# Patient Record
Sex: Female | Born: 1965 | Hispanic: No | Marital: Married | Smoking: Never smoker
Health system: Southern US, Community
[De-identification: ages and names within clinical notes are randomized; demographics above are authoritative.]

## PROBLEM LIST (undated history)

## (undated) DIAGNOSIS — T7840XA Allergy, unspecified, initial encounter: Secondary | ICD-10-CM

## (undated) DIAGNOSIS — E785 Hyperlipidemia, unspecified: Secondary | ICD-10-CM

## (undated) DIAGNOSIS — N83201 Unspecified ovarian cyst, right side: Secondary | ICD-10-CM

## (undated) DIAGNOSIS — Z973 Presence of spectacles and contact lenses: Secondary | ICD-10-CM

## (undated) DIAGNOSIS — M1990 Unspecified osteoarthritis, unspecified site: Secondary | ICD-10-CM

## (undated) DIAGNOSIS — I10 Essential (primary) hypertension: Secondary | ICD-10-CM

## (undated) DIAGNOSIS — D229 Melanocytic nevi, unspecified: Secondary | ICD-10-CM

## (undated) DIAGNOSIS — K219 Gastro-esophageal reflux disease without esophagitis: Secondary | ICD-10-CM

## (undated) HISTORY — DX: Unspecified osteoarthritis, unspecified site: M19.90

## (undated) HISTORY — DX: Hyperlipidemia, unspecified: E78.5

## (undated) HISTORY — DX: Allergy, unspecified, initial encounter: T78.40XA

## (undated) HISTORY — DX: Essential (primary) hypertension: I10

## (undated) HISTORY — PX: OTHER SURGICAL HISTORY: SHX169

## (undated) HISTORY — DX: Melanocytic nevi, unspecified: D22.9

---

## 2007-12-15 LAB — HM PAP SMEAR: HM Pap smear: NORMAL

## 2007-12-20 ENCOUNTER — Encounter: Admission: RE | Admit: 2007-12-20 | Discharge: 2007-12-20 | Payer: Self-pay | Admitting: Family Medicine

## 2011-01-29 ENCOUNTER — Ambulatory Visit (INDEPENDENT_AMBULATORY_CARE_PROVIDER_SITE_OTHER): Payer: BC Managed Care – PPO

## 2011-01-29 DIAGNOSIS — M79609 Pain in unspecified limb: Secondary | ICD-10-CM

## 2011-02-05 ENCOUNTER — Ambulatory Visit (INDEPENDENT_AMBULATORY_CARE_PROVIDER_SITE_OTHER): Payer: BC Managed Care – PPO

## 2011-02-05 DIAGNOSIS — S60459A Superficial foreign body of unspecified finger, initial encounter: Secondary | ICD-10-CM

## 2011-02-05 DIAGNOSIS — M12269 Villonodular synovitis (pigmented), unspecified knee: Secondary | ICD-10-CM

## 2011-10-25 ENCOUNTER — Ambulatory Visit (INDEPENDENT_AMBULATORY_CARE_PROVIDER_SITE_OTHER): Payer: BC Managed Care – PPO | Admitting: Physician Assistant

## 2011-10-25 VITALS — BP 122/80 | HR 86 | Temp 97.5°F | Resp 16 | Ht 61.0 in | Wt 132.0 lb

## 2011-10-25 DIAGNOSIS — L309 Dermatitis, unspecified: Secondary | ICD-10-CM

## 2011-10-25 DIAGNOSIS — L259 Unspecified contact dermatitis, unspecified cause: Secondary | ICD-10-CM

## 2011-10-25 MED ORDER — TRIAMCINOLONE ACETONIDE 0.1 % EX CREA: TOPICAL_CREAM | Freq: Two times a day (BID) | CUTANEOUS | Status: DC

## 2011-10-25 NOTE — Patient Instructions (Signed)
Take claritin or zyrtec each morning, and benadryl at bedtime (since it can make you drowsy).

## 2011-10-25 NOTE — Progress Notes (Signed)
  Subjective:    Patient ID: Ashlee Davis, female    DOB: October 05, 1965, 46 y.o.   MRN: [id number]  HPI This 46 y.o. female presents for evaluation of rash on the trunk.  No new products, exposures, pets.  She has a history of similar rash with drinking alcohol, and eating some seafoods/shellfish, but has not consumed any of those items.  Mildly itchy.  Has taken a few doses of benadryl since these symptoms began.    Review of Systems As above, also mild nasal congestion and drainage.  No CP, SOB, vision change, HA, dizziness, swelling.  History reviewed. No pertinent past medical history.  History reviewed. No pertinent past surgical history.  Prior to Admission medications   Medication Sig Start Date End Date Taking? Authorizing Provider  aspirin 81 MG tablet Take 81 mg by mouth daily.   Yes Historical Provider, MD    No Known Allergies  History   Social History  . Marital Status: Married    Spouse Name: Ferninand    Number of Children: 0  . Years of Education: 12   Occupational History  . homemaker    Social History Main Topics  . Smoking status: Never Smoker   . Smokeless tobacco: Never Used  . Alcohol Use: No  . Drug Use: No  . Sexually Active: Yes -- Female partner(s)   Other Topics Concern  . Not on file   Social History Narrative   From [place] left [place] age 50 for the [place].    Family History  Problem Relation Age of Onset  . Hypertension Mother        Objective:   Physical Exam  Blood pressure 122/80, pulse 86, temperature 97.5 F (36.4 C), temperature source Oral, resp. rate 16, height 5\' 1"  (1.549 m), weight 132 lb (59.875 kg), last menstrual period 09/16/2011, SpO2 98.00%. Body mass index is 24.94 kg/(m^2). Well-developed, well nourished [place] who is awake, alert and oriented, in NAD. HEENT: [place]/AT, sclera and conjunctiva are clear.   Neck: supple, non-tender, no lymphadenopathy, thyromegaly. Heart: RRR, no murmur Lungs: normal effort,  CTA Extremities: no cyanosis, clubbing or edema. Skin: warm and dry with a small maculopapular rash of the trunk, with scattered lesions on the thighs and arms.  Spares the face, scalp, palms, soles.    Assessment & Plan:   1. Dermatitis  triamcinolone cream (KENALOG) 0.1 %   Patient Instructions  Take claritin or zyrtec each morning, and benadryl at bedtime (since it can make you drowsy).

## 2012-07-01 ENCOUNTER — Encounter: Payer: Self-pay | Admitting: Family Medicine

## 2012-07-01 ENCOUNTER — Ambulatory Visit (INDEPENDENT_AMBULATORY_CARE_PROVIDER_SITE_OTHER): Payer: BC Managed Care – PPO | Admitting: Family Medicine

## 2012-07-01 ENCOUNTER — Ambulatory Visit: Payer: BC Managed Care – PPO | Admitting: Family Medicine

## 2012-07-01 VITALS — BP 131/85 | HR 67 | Temp 96.6°F | Resp 18 | Ht 60.5 in | Wt 132.0 lb

## 2012-07-01 DIAGNOSIS — Z0000 Encounter for general adult medical examination without abnormal findings: Secondary | ICD-10-CM

## 2012-07-01 DIAGNOSIS — Z23 Encounter for immunization: Secondary | ICD-10-CM

## 2012-07-01 DIAGNOSIS — Z0001 Encounter for general adult medical examination with abnormal findings: Secondary | ICD-10-CM | POA: Insufficient documentation

## 2012-07-01 LAB — POCT UA - MICROSCOPIC ONLY
Bacteria, U Microscopic: NEGATIVE
Casts, Ur, LPF, POC: NEGATIVE
Crystals, Ur, HPF, POC: NEGATIVE
Mucus, UA: NEGATIVE

## 2012-07-01 LAB — COMPREHENSIVE METABOLIC PANEL
ALT: 11 U/L (ref 0–35)
AST: 15 U/L (ref 0–37)
Alkaline Phosphatase: 45 U/L (ref 39–117)
BUN: 11 mg/dL (ref 6–23)
Calcium: 9.3 mg/dL (ref 8.4–10.5)
Chloride: 106 mEq/L (ref 96–112)
Creat: 0.59 mg/dL (ref 0.50–1.10)
Potassium: 4.4 mEq/L (ref 3.5–5.3)

## 2012-07-01 LAB — LIPID PANEL
Cholesterol: 197 mg/dL (ref 0–200)
LDL Cholesterol: 110 mg/dL — ABNORMAL HIGH (ref 0–99)
Total CHOL/HDL Ratio: 3.9 Ratio
Triglycerides: 181 mg/dL — ABNORMAL HIGH (ref <150)
VLDL: 36 mg/dL (ref 0–40)

## 2012-07-01 LAB — POCT URINALYSIS DIPSTICK
Bilirubin, UA: NEGATIVE
Glucose, UA: NEGATIVE
Ketones, UA: NEGATIVE
Spec Grav, UA: 1.02
pH, UA: 6.5

## 2012-07-01 LAB — CBC WITH DIFFERENTIAL/PLATELET
Basophils Absolute: 0 10*3/uL (ref 0.0–0.1)
Basophils Relative: 1 % (ref 0–1)
HCT: 43.7 % (ref 36.0–46.0)
Lymphocytes Relative: 38 % (ref 12–46)
MCHC: 34.3 g/dL (ref 30.0–36.0)
Monocytes Absolute: 0.4 10*3/uL (ref 0.1–1.0)
Neutro Abs: 3.8 10*3/uL (ref 1.7–7.7)
Platelets: 256 10*3/uL (ref 150–400)
RDW: 13.6 % (ref 11.5–15.5)
WBC: 7.1 10*3/uL (ref 4.0–10.5)

## 2012-07-01 NOTE — Progress Notes (Signed)
[address]   [place], [place]  [address]   [phone]  Subjective:    Patient ID: Ashlee Davis, female    DOB: 10/18/1965, 47 y.o.   MRN: [id number]  HPI This 47 y.o. female presents for complete physical exam.  Last physical 2013. Pap smear a few years ago; not interested in repeat today. Mammogram; not interested; refusing repeat mammogram; performs self breast exams monthly.   Colonoscopy never.   Tetanus UTD but gets lots of bites; not sure of last date; requesting repeat vaccination.  No flu vaccines. Eye exam two years ago; +glasses; due for follow-up. Dental exam last year 2013.   1.  R elbow pain: no injury.  R handed.  Along epicondylar region; no swelling; no warmth; no redness.  Works in garden a lot; lifts a lot with R hand.  No numbness/tingling/weakness.   Review of Systems  Constitutional: Negative for fever, chills, diaphoresis, activity change, appetite change, fatigue and unexpected weight change.  HENT: Positive for congestion, rhinorrhea, sneezing and postnasal drip. Negative for hearing loss, ear pain, sore throat, facial swelling, neck pain, neck stiffness, dental problem, voice change, sinus pressure, tinnitus and ear discharge.   Eyes: Negative for photophobia, pain, discharge, redness, itching and visual disturbance.  Respiratory: Negative for cough, shortness of breath, wheezing and stridor.   Cardiovascular: Negative for chest pain, palpitations and leg swelling.  Gastrointestinal: Negative for nausea, vomiting, abdominal pain, diarrhea, constipation, blood in stool, abdominal distention, anal bleeding and rectal pain.  Endocrine: Negative for cold intolerance, heat intolerance, polydipsia, polyphagia and polyuria.  Genitourinary: Negative for dysuria, urgency, frequency, hematuria, menstrual problem and pelvic pain.  Musculoskeletal: Positive for arthralgias. Negative for myalgias, back pain, joint swelling and gait problem.  Skin: Negative for  color change, pallor, rash and wound.  Allergic/Immunologic: Negative for environmental allergies, food allergies and immunocompromised state.  Neurological: Negative for dizziness, tremors, seizures, syncope, facial asymmetry, speech difficulty, weakness, light-headedness, numbness and headaches.  Hematological: Negative for adenopathy. Does not bruise/bleed easily.  Psychiatric/Behavioral: Negative for sleep disturbance and dysphoric mood. The patient is not nervous/anxious.     Past Medical History  Diagnosis Date  . Allergy     History reviewed. No pertinent past surgical history.  Prior to Admission medications   Medication Sig Start Date End Date Taking? Authorizing Provider  aspirin 81 MG tablet Take 81 mg by mouth daily.   Yes Historical Provider, MD    Allergies  Allergen Reactions  . Wine (Alcohol)     History   Social History  . Marital Status: Married    Spouse Name: Ferninand    Number of Children: 0  . Years of Education: 12   Occupational History  . homemaker    Social History Main Topics  . Smoking status: Never Smoker   . Smokeless tobacco: Never Used  . Alcohol Use: No  . Drug Use: No  . Sexually Active: Yes -- Female partner(s)   Other Topics Concern  . Not on file   Social History Narrative   Marital status: married since 1999.      Children:  None      Lives: with husband; from [place]; moved to [place] to [place].   From [place] left at age 89 for the [place].      Tobacco: never      Alcohol: socially but rare due to skin flushing.      Exercises:  Walking every morning; works in garden a lot.  Family History  Problem Relation Age of Onset  . Hypertension Mother        Objective:   Physical Exam  Nursing note and vitals reviewed. Constitutional: She is oriented to person, place, and time. She appears well-developed and well-nourished. No distress.  HENT:  Head: Normocephalic and atraumatic.  Right Ear: External ear  normal.  Left Ear: External ear normal.  Nose: Nose normal.  Mouth/Throat: Oropharynx is clear and moist.  Eyes: Conjunctivae and EOM are normal. Pupils are equal, round, and reactive to light.  Neck: Normal range of motion. Neck supple. No thyromegaly present.  Cardiovascular: Normal rate, regular rhythm and normal heart sounds.   No murmur heard. Pulmonary/Chest: Effort normal and breath sounds normal.  Abdominal: Soft. Bowel sounds are normal. She exhibits no distension. There is no tenderness. There is no rebound and no guarding.  Genitourinary: No breast swelling, tenderness, discharge or bleeding.  Musculoskeletal: Normal range of motion.  Lymphadenopathy:    She has no cervical adenopathy.  Neurological: She is alert and oriented to person, place, and time. No cranial nerve deficit. She exhibits normal muscle tone. Coordination normal.  Skin: Skin is warm and dry. No rash noted. She is not diaphoretic. No erythema.  Psychiatric: She has a normal mood and affect. Her behavior is normal. Judgment and thought content normal.   TDAP ADMINISTERED IN OFFICE.    Assessment & Plan:  Routine general medical examination at a health care facility - Plan: CBC with Differential, Comprehensive metabolic panel, Hemoglobin A1c, Lipid panel, TSH, Vitamin B12, Vitamin D 25 hydroxy, POCT urinalysis dipstick, Tdap vaccine greater than or equal to 7yo IM, POCT UA - Microscopic Only, CANCELED: EKG 12-Lead

## 2012-07-01 NOTE — Assessment & Plan Note (Signed)
Anticipatory guidance --- weight maintenance, exercise, monitor BP at home.  Refused pap smear and mammogram; breast exam completed; declined pelvic exam.  S/p TDAP in office. Obtain labs.

## 2012-07-01 NOTE — Assessment & Plan Note (Signed)
Administered in office.

## 2012-07-13 ENCOUNTER — Encounter: Payer: Self-pay | Admitting: Family Medicine

## 2012-08-09 ENCOUNTER — Encounter: Payer: Self-pay | Admitting: *Deleted

## 2012-12-08 ENCOUNTER — Other Ambulatory Visit: Payer: Self-pay

## 2014-01-17 ENCOUNTER — Ambulatory Visit (INDEPENDENT_AMBULATORY_CARE_PROVIDER_SITE_OTHER): Payer: BC Managed Care – PPO | Admitting: Family Medicine

## 2014-01-17 ENCOUNTER — Encounter: Payer: Self-pay | Admitting: Family Medicine

## 2014-01-17 VITALS — BP 116/90 | HR 74 | Temp 97.4°F | Resp 16 | Ht 60.0 in | Wt 135.4 lb

## 2014-01-17 DIAGNOSIS — Z131 Encounter for screening for diabetes mellitus: Secondary | ICD-10-CM

## 2014-01-17 DIAGNOSIS — Z1329 Encounter for screening for other suspected endocrine disorder: Secondary | ICD-10-CM

## 2014-01-17 DIAGNOSIS — E785 Hyperlipidemia, unspecified: Secondary | ICD-10-CM

## 2014-01-17 DIAGNOSIS — Z0000 Encounter for general adult medical examination without abnormal findings: Secondary | ICD-10-CM

## 2014-01-17 DIAGNOSIS — Z01419 Encounter for gynecological examination (general) (routine) without abnormal findings: Secondary | ICD-10-CM

## 2014-01-17 LAB — POCT URINALYSIS DIPSTICK
Bilirubin, UA: NEGATIVE
Glucose, UA: NEGATIVE
Ketones, UA: NEGATIVE
LEUKOCYTES UA: NEGATIVE
NITRITE UA: NEGATIVE
Protein, UA: NEGATIVE
Urobilinogen, UA: 0.2
pH, UA: 5.5

## 2014-01-17 LAB — CBC WITH DIFFERENTIAL/PLATELET
BASOS PCT: 0 % (ref 0–1)
Basophils Absolute: 0 10*3/uL (ref 0.0–0.1)
EOS ABS: 0.1 10*3/uL (ref 0.0–0.7)
Eosinophils Relative: 1 % (ref 0–5)
HCT: 42.1 % (ref 36.0–46.0)
HEMOGLOBIN: 14.6 g/dL (ref 12.0–15.0)
LYMPHS ABS: 2.9 10*3/uL (ref 0.7–4.0)
LYMPHS PCT: 38 % (ref 12–46)
MCH: 31.9 pg (ref 26.0–34.0)
MCHC: 34.7 g/dL (ref 30.0–36.0)
MCV: 92.1 fL (ref 78.0–100.0)
MONO ABS: 0.4 10*3/uL (ref 0.1–1.0)
MONOS PCT: 5 % (ref 3–12)
MPV: 9.5 fL (ref 9.4–12.4)
NEUTROS PCT: 56 % (ref 43–77)
Neutro Abs: 4.2 10*3/uL (ref 1.7–7.7)
Platelets: 248 10*3/uL (ref 150–400)
RBC: 4.57 MIL/uL (ref 3.87–5.11)
RDW: 13.7 % (ref 11.5–15.5)
WBC: 7.5 10*3/uL (ref 4.0–10.5)

## 2014-01-17 NOTE — Progress Notes (Signed)
   Subjective:    Patient ID: Ashlee Davis, female    DOB: 1966/02/02, 48 y.o.   MRN: [id number]  HPI    Review of Systems  Constitutional: Negative.   HENT: Negative.   Eyes: Negative.   Respiratory: Negative.   Cardiovascular: Negative.   Gastrointestinal: Negative.   Endocrine: Negative.   Genitourinary: Negative.   Musculoskeletal: Negative.   Skin: Negative.   Allergic/Immunologic: Negative.   Neurological: Negative.   Hematological: Negative.   Psychiatric/Behavioral: Negative.        Objective:   Physical Exam        Assessment & Plan:

## 2014-01-17 NOTE — Patient Instructions (Signed)

## 2014-01-17 NOTE — Progress Notes (Addendum)
Subjective:    Patient ID: Ashlee Davis, female    DOB: October 17, 1965, 48 y.o.   MRN: [id number]  01/17/2014  Annual Exam   HPI This 48 y.o. female presents for Complete Physical Examination.  Last physical: 07/01/2012 Pap smear: 2012; regular menses; monthly; this menses short and dark.  Declining today. Mammogram:  2009; declining this year. Colonoscopy:  never TDAP: 2014 Influenza: never Eye exam:  Glasses; 2015. Dental exam:  Every year in [place].    Review of Systems  Constitutional: Negative for fever, chills, diaphoresis, activity change, appetite change, fatigue and unexpected weight change.  HENT: Negative for congestion, dental problem, drooling, ear discharge, ear pain, facial swelling, hearing loss, mouth sores, nosebleeds, postnasal drip, rhinorrhea, sinus pressure, sneezing, sore throat, tinnitus, trouble swallowing and voice change.   Eyes: Negative for photophobia, pain, discharge, redness, itching and visual disturbance.  Respiratory: Negative for apnea, cough, choking, chest tightness, shortness of breath, wheezing and stridor.   Cardiovascular: Negative for chest pain, palpitations and leg swelling.  Gastrointestinal: Negative for nausea, vomiting, abdominal pain, diarrhea, constipation, blood in stool, abdominal distention, anal bleeding and rectal pain.  Endocrine: Negative for cold intolerance, heat intolerance, polydipsia, polyphagia and polyuria.  Genitourinary: Negative for dysuria, urgency, frequency, hematuria, flank pain, decreased urine volume, vaginal bleeding, vaginal discharge, enuresis, difficulty urinating, genital sores, vaginal pain, menstrual problem, pelvic pain and dyspareunia.  Musculoskeletal: Negative for myalgias, back pain, joint swelling, arthralgias, gait problem, neck pain and neck stiffness.  Skin: Negative for color change, pallor, rash and wound.  Allergic/Immunologic: Negative for environmental allergies, food allergies and  immunocompromised state.  Neurological: Negative for dizziness, tremors, seizures, syncope, facial asymmetry, speech difficulty, weakness, light-headedness, numbness and headaches.  Hematological: Negative for adenopathy. Does not bruise/bleed easily.  Psychiatric/Behavioral: Negative for suicidal ideas, hallucinations, behavioral problems, confusion, sleep disturbance, self-injury, dysphoric mood, decreased concentration and agitation. The patient is not nervous/anxious and is not hyperactive.     Past Medical History  Diagnosis Date  . Allergy   . Nevus   . Other and unspecified hyperlipidemia   . Arthritis    History reviewed. No pertinent past surgical history. Allergies  Allergen Reactions  . Wine [Alcohol]    History   Social History  . Marital Status: Married    Spouse Name: Ferninand  . Number of Children: 0  . Years of Education: 12   Occupational History  . homemaker    Social History Main Topics  . Smoking status: Never Smoker   . Smokeless tobacco: Never Used  . Alcohol Use: No  . Drug Use: No  . Sexual Activity:    Partners: Male   Other Topics Concern  . Not on file   Social History Narrative   Marital status: married since 1999.  Happily married; no abuse.      Children:  None      Lives: with husband; from [place]; moved to [place] to [place].   From the [place], left at age 58 for the [place].      Tobacco: never      Alcohol: socially but rare due to skin flushing.      Exercises:  Walking every morning; works in garden a lot.   Always uses seat belts. Smoke alarm in the home. No guns in the home.   Caffeine use: in moderation daily.   Family History  Problem Relation Age of Onset  . Hypertension Mother   . Heart disease Mother 57    AMI   .  Hypertension Father         Objective:    BP 116/90 mmHg  Pulse 74  Temp(Src) 97.4 F (36.3 C) (Oral)  Resp 16  Ht 5' (1.524 m)  Wt 135 lb 6.4 oz (61.417 kg)  BMI 26.44 kg/m2  SpO2 96%   LMP 01/14/2014 Physical Exam  Constitutional: She is oriented to person, place, and time. She appears well-developed and well-nourished. No distress.  HENT:  Head: Normocephalic and atraumatic.  Right Ear: External ear normal.  Left Ear: External ear normal.  Nose: Nose normal.  Mouth/Throat: Oropharynx is clear and moist.  Eyes: Conjunctivae and EOM are normal. Pupils are equal, round, and reactive to light.  Neck: Normal range of motion and full passive range of motion without pain. Neck supple. No JVD present. Carotid bruit is not present. No thyromegaly present.  Cardiovascular: Normal rate, regular rhythm and normal heart sounds.  Exam reveals no gallop and no friction rub.   No murmur heard. Pulmonary/Chest: Effort normal and breath sounds normal. She has no wheezes. She has no rales. Right breast exhibits no inverted nipple, no mass, no nipple discharge, no skin change and no tenderness. Left breast exhibits no inverted nipple, no mass, no nipple discharge, no skin change and no tenderness. Breasts are symmetrical.  Abdominal: Soft. Bowel sounds are normal. She exhibits no distension and no mass. There is no tenderness. There is no rebound and no guarding.  Genitourinary: Rectum normal, vagina normal and uterus normal. There is no rash, tenderness, lesion or injury on the right labia. There is no rash, tenderness, lesion or injury on the left labia. Cervix exhibits no motion tenderness, no discharge and no friability. Right adnexum displays no mass, no tenderness and no fullness. Left adnexum displays no mass, no tenderness and no fullness.  Musculoskeletal:       Right shoulder: Normal.       Left shoulder: Normal.       Cervical back: Normal.  Lymphadenopathy:    She has no cervical adenopathy.  Neurological: She is alert and oriented to person, place, and time. She has normal reflexes. No cranial nerve deficit. She exhibits normal muscle tone. Coordination normal.  Skin: Skin is warm  and dry. No rash noted. She is not diaphoretic. No erythema. No pallor.  Psychiatric: She has a normal mood and affect. Her behavior is normal. Judgment and thought content normal.  Nursing note and vitals reviewed.       Assessment & Plan:   1. Routine general medical examination at a health care facility   2. Hyperlipidemia   3. Screening for diabetes mellitus   4. Screening for thyroid disorder   5. Encounter for routine gynecological examination     1. Complete Physical Examination: anticipatory guidance -- exercise, weight loss.  Pap smear obtained; breast exam performed; pt refuses mammogram.  Immunizations reviewed; pt refuses flu vaccine.   2.  Gynecological exam: pap smear obtained; pt refuses mammogram.  Perimenopausal state; menses are lighter. 3.  Screening DMII: obtain glucose, HgbA1c. 4.  Screening thyroid: obtain TSH. 5. Hyperlipidemia: obtain FLP.   No orders of the defined types were placed in this encounter.    Return in about 1 year (around 01/18/2015) for complete physical examiniation.    Reginia Forts, M.D.  [facility] [address] [place], [place]  [id number] [phone] phone [phone] fax

## 2014-01-18 LAB — COMPLETE METABOLIC PANEL WITH GFR
ALK PHOS: 58 U/L (ref 39–117)
ALT: 11 U/L (ref 0–35)
AST: 16 U/L (ref 0–37)
Albumin: 4.5 g/dL (ref 3.5–5.2)
BUN: 12 mg/dL (ref 6–23)
CALCIUM: 9.4 mg/dL (ref 8.4–10.5)
CO2: 25 meq/L (ref 19–32)
CREATININE: 0.57 mg/dL (ref 0.50–1.10)
Chloride: 104 mEq/L (ref 96–112)
Glucose, Bld: 92 mg/dL (ref 70–99)
Potassium: 3.9 mEq/L (ref 3.5–5.3)
Sodium: 139 mEq/L (ref 135–145)
Total Bilirubin: 1 mg/dL (ref 0.2–1.2)
Total Protein: 7.3 g/dL (ref 6.0–8.3)

## 2014-01-18 LAB — HEMOGLOBIN A1C
HEMOGLOBIN A1C: 6.1 % — AB (ref <5.7)
MEAN PLASMA GLUCOSE: 128 mg/dL — AB (ref <117)

## 2014-01-18 LAB — LIPID PANEL
CHOLESTEROL: 242 mg/dL — AB (ref 0–200)
HDL: 55 mg/dL (ref >39)
LDL CALC: 153 mg/dL — AB (ref 0–99)
TRIGLYCERIDES: 172 mg/dL — AB (ref <150)
Total CHOL/HDL Ratio: 4.4 Ratio
VLDL: 34 mg/dL (ref 0–40)

## 2014-01-18 LAB — TSH: TSH: 1.243 u[IU]/mL (ref 0.350–4.500)

## 2014-01-19 LAB — PAP IG AND HPV HIGH-RISK: HPV DNA High Risk: NOT DETECTED

## 2014-02-14 ENCOUNTER — Telehealth: Payer: Self-pay | Admitting: Family Medicine

## 2014-02-14 NOTE — Telephone Encounter (Signed)
-----   Message from Wardell Honour, MD sent at 01/29/2014 11:01 AM EST ----- Scheduling --- please schedule OV in six months for follow-up of glucose intolerance, high cholesterol.

## 2014-02-14 NOTE — Telephone Encounter (Signed)
Spoke with patient. She was upset saying we call too much. She is not interested in taking medications for her cholesterol and she did not want [place] to call her again. Then she hung up.

## 2014-05-08 ENCOUNTER — Ambulatory Visit (INDEPENDENT_AMBULATORY_CARE_PROVIDER_SITE_OTHER): Payer: BLUE CROSS/BLUE SHIELD | Admitting: Family Medicine

## 2014-05-08 VITALS — BP 110/72 | HR 96 | Temp 97.7°F | Resp 18 | Ht 61.0 in | Wt 139.0 lb

## 2014-05-08 DIAGNOSIS — R519 Headache, unspecified: Secondary | ICD-10-CM

## 2014-05-08 DIAGNOSIS — R05 Cough: Secondary | ICD-10-CM | POA: Diagnosis not present

## 2014-05-08 DIAGNOSIS — J069 Acute upper respiratory infection, unspecified: Secondary | ICD-10-CM

## 2014-05-08 DIAGNOSIS — R51 Headache: Secondary | ICD-10-CM | POA: Diagnosis not present

## 2014-05-08 DIAGNOSIS — R059 Cough, unspecified: Secondary | ICD-10-CM

## 2014-05-08 MED ORDER — HYDROCODONE-HOMATROPINE 5-1.5 MG/5ML PO SYRP: 5.0000 mL | ORAL_SOLUTION | ORAL | Status: DC | PRN

## 2014-05-08 MED ORDER — BENZONATATE 100 MG PO CAPS: 100.0000 mg | ORAL_CAPSULE | Freq: Three times a day (TID) | ORAL | Status: DC | PRN

## 2014-05-08 NOTE — Patient Instructions (Signed)
Drink plenty of fluids and broth and get enough rest  Take Tylenol (acetaminophen) or ibuprofen (Advil, Motrin, etc.) if needed for headache   Take the cough syrup if needed for nighttime cough. It will make you sleepy.  Take the cough pills if needed for daytime cough  Return if high fever, coughing worse, shortness of breath, or worse general illness.  Return as needed   Upper Respiratory Infection, Adult An upper respiratory infection (URI) is also sometimes known as the common cold. The upper respiratory tract includes the nose, sinuses, throat, trachea, and bronchi. Bronchi are the airways leading to the lungs. Most people improve within 1 week, but symptoms can last up to 2 weeks. A residual cough may last even longer.  CAUSES Many different viruses can infect the tissues lining the upper respiratory tract. The tissues become irritated and inflamed and often become very moist. Mucus production is also common. A cold is contagious. You can easily spread the virus to others by oral contact. This includes kissing, sharing a glass, coughing, or sneezing. Touching your mouth or nose and then touching a surface, which is then touched by another person, can also spread the virus. SYMPTOMS  Symptoms typically develop 1 to 3 days after you come in contact with a cold virus. Symptoms vary from person to person. They may include:  Runny nose.  Sneezing.  Nasal congestion.  Sinus irritation.  Sore throat.  Loss of voice (laryngitis).  Cough.  Fatigue.  Muscle aches.  Loss of appetite.  Headache.  Low-grade fever. DIAGNOSIS  You might diagnose your own cold based on familiar symptoms, since most people get a cold 2 to 3 times a year. Your caregiver can confirm this based on your exam. Most importantly, your caregiver can check that your symptoms are not due to another disease such as strep throat, sinusitis, pneumonia, asthma, or epiglottitis. Blood tests, throat tests, and  X-rays are not necessary to diagnose a common cold, but they may sometimes be helpful in excluding other more serious diseases. Your caregiver will decide if any further tests are required. RISKS AND COMPLICATIONS  You may be at risk for a more severe case of the common cold if you smoke cigarettes, have chronic heart disease (such as heart failure) or lung disease (such as asthma), or if you have a weakened immune system. The very young and very old are also at risk for more serious infections. Bacterial sinusitis, middle ear infections, and bacterial pneumonia can complicate the common cold. The common cold can worsen asthma and chronic obstructive pulmonary disease (COPD). Sometimes, these complications can require emergency medical care and may be life-threatening. PREVENTION  The best way to protect against getting a cold is to practice good hygiene. Avoid oral or hand contact with people with cold symptoms. Wash your hands often if contact occurs. There is no clear evidence that vitamin C, vitamin E, echinacea, or exercise reduces the chance of developing a cold. However, it is always recommended to get plenty of rest and practice good nutrition. TREATMENT  Treatment is directed at relieving symptoms. There is no cure. Antibiotics are not effective, because the infection is caused by a virus, not by bacteria. Treatment may include:  Increased fluid intake. Sports drinks offer valuable electrolytes, sugars, and fluids.  Breathing heated mist or steam (vaporizer or shower).  Eating chicken soup or other clear broths, and maintaining good nutrition.  Getting plenty of rest.  Using gargles or lozenges for comfort.  Controlling fevers with  ibuprofen or acetaminophen as directed by your caregiver.  Increasing usage of your inhaler if you have asthma. Zinc gel and zinc lozenges, taken in the first 24 hours of the common cold, can shorten the duration and lessen the severity of symptoms. Pain  medicines may help with fever, muscle aches, and throat pain. A variety of non-prescription medicines are available to treat congestion and runny nose. Your caregiver can make recommendations and may suggest nasal or lung inhalers for other symptoms.  HOME CARE INSTRUCTIONS   Only take over-the-counter or prescription medicines for pain, discomfort, or fever as directed by your caregiver.  Use a warm mist humidifier or inhale steam from a shower to increase air moisture. This may keep secretions moist and make it easier to breathe.  Drink enough water and fluids to keep your urine clear or pale yellow.  Rest as needed.  Return to work when your temperature has returned to normal or as your caregiver advises. You may need to stay home longer to avoid infecting others. You can also use a face mask and careful hand washing to prevent spread of the virus. SEEK MEDICAL CARE IF:   After the first few days, you feel you are getting worse rather than better.  You need your caregiver's advice about medicines to control symptoms.  You develop chills, worsening shortness of breath, or brown or red sputum. These may be signs of pneumonia.  You develop yellow or brown nasal discharge or pain in the face, especially when you bend forward. These may be signs of sinusitis.  You develop a fever, swollen neck glands, pain with swallowing, or white areas in the back of your throat. These may be signs of strep throat. SEEK IMMEDIATE MEDICAL CARE IF:   You have a fever.  You develop severe or persistent headache, ear pain, sinus pain, or chest pain.  You develop wheezing, a prolonged cough, cough up blood, or have a change in your usual mucus (if you have chronic lung disease).  You develop sore muscles or a stiff neck. Document Released: 07/15/2000 Document Revised: 04/13/2011 Document Reviewed: 04/26/2013 [facility] Patient Information 2015 [place], [place]. This information is not intended to replace  advice given to you by your health care provider. Make sure you discuss any questions you have with your health care provider.

## 2014-05-08 NOTE — Progress Notes (Signed)
Subjective: 49 year old lady who is here with a history of sinus congestion and cough and throat irritation. She has had some yellow looking mucus. This is only been going on for couple of days. Her husband had it just prior to her. She does not smoke. She does work a Designer, multimedia. She has been coughing a lot at night.  Objective: No acute distress. TMs normal. Nose congested. Throat clear. Neck supple without nodes. Chest clear. Heart regular without murmurs.  Assessment: Viral upper respiratory infection Cough Headache congestion  Plan: Symptomatic treatment Return if worse See instructions and orders

## 2014-07-16 ENCOUNTER — Ambulatory Visit: Payer: Self-pay | Admitting: Family Medicine

## 2015-10-29 ENCOUNTER — Encounter: Payer: Self-pay | Admitting: Family Medicine

## 2015-10-29 ENCOUNTER — Other Ambulatory Visit: Payer: Self-pay | Admitting: Family Medicine

## 2015-10-29 ENCOUNTER — Ambulatory Visit: Payer: BLUE CROSS/BLUE SHIELD | Admitting: Family Medicine

## 2015-10-29 VITALS — BP 134/102 | HR 85 | Temp 98.5°F | Resp 16 | Ht 60.5 in | Wt 135.0 lb

## 2015-10-29 DIAGNOSIS — A09 Infectious gastroenteritis and colitis, unspecified: Secondary | ICD-10-CM | POA: Diagnosis not present

## 2015-10-29 DIAGNOSIS — Z0000 Encounter for general adult medical examination without abnormal findings: Secondary | ICD-10-CM | POA: Diagnosis not present

## 2015-10-29 DIAGNOSIS — R197 Diarrhea, unspecified: Secondary | ICD-10-CM

## 2015-10-29 DIAGNOSIS — Z124 Encounter for screening for malignant neoplasm of cervix: Secondary | ICD-10-CM | POA: Diagnosis not present

## 2015-10-29 DIAGNOSIS — Z131 Encounter for screening for diabetes mellitus: Secondary | ICD-10-CM

## 2015-10-29 DIAGNOSIS — E7800 Pure hypercholesterolemia, unspecified: Secondary | ICD-10-CM

## 2015-10-29 DIAGNOSIS — Z1329 Encounter for screening for other suspected endocrine disorder: Secondary | ICD-10-CM

## 2015-10-29 DIAGNOSIS — Z114 Encounter for screening for human immunodeficiency virus [HIV]: Secondary | ICD-10-CM | POA: Diagnosis not present

## 2015-10-29 LAB — POCT URINALYSIS DIP (MANUAL ENTRY)
BILIRUBIN UA: NEGATIVE
BILIRUBIN UA: NEGATIVE
GLUCOSE UA: NEGATIVE
Leukocytes, UA: NEGATIVE
NITRITE UA: NEGATIVE
Protein Ur, POC: NEGATIVE
SPEC GRAV UA: 1.025
Urobilinogen, UA: 0.2
pH, UA: 5.5

## 2015-10-29 NOTE — Patient Instructions (Addendum)
IF you received an x-ray today, you will receive an invoice from [facility]. Please contact [facility] at [phone] with questions or concerns regarding your invoice.   IF you received labwork today, you will receive an invoice from [employer]. Please contact [facility] at [phone] with questions or concerns regarding your invoice.   Our billing staff will not be able to assist you with questions regarding bills from these companies.  You will be contacted with the lab results as soon as they are available. The fastest way to get your results is to activate your My Chart account. Instructions are located on the last page of this paperwork. If you have not heard from [place] regarding the results in 2 weeks, please contact this office.     Diarrhea Diarrhea is frequent loose and watery bowel movements. It can cause you to feel weak and dehydrated. Dehydration can cause you to become tired and thirsty, have a dry mouth, and have decreased urination that often is dark yellow. Diarrhea is a sign of another problem, most often an infection that will not last long. In most cases, diarrhea typically lasts 2-3 days. However, it can last longer if it is a sign of something more serious. It is important to treat your diarrhea as directed by your caregiver to lessen or prevent future episodes of diarrhea. CAUSES  Some common causes include:  Gastrointestinal infections caused by viruses, bacteria, or parasites.  Food poisoning or food allergies.  Certain medicines, such as antibiotics, chemotherapy, and laxatives.  Artificial sweeteners and fructose.  Digestive disorders. HOME CARE INSTRUCTIONS  Ensure adequate fluid intake (hydration): Have 1 cup (8 oz) of fluid for each diarrhea episode. Avoid fluids that contain simple sugars or sports drinks, fruit juices, whole milk products, and sodas. Your urine should be clear or pale yellow if you are  drinking enough fluids. Hydrate with an oral rehydration solution that you can purchase at pharmacies, retail stores, and online. You can prepare an oral rehydration solution at home by mixing the following ingredients together:   - tsp table salt.   tsp baking soda.   tsp salt substitute containing potassium chloride.  1  tablespoons sugar.  1 L (34 oz) of water.  Certain foods and beverages may increase the speed at which food moves through the gastrointestinal (GI) tract. These foods and beverages should be avoided and include:  Caffeinated and alcoholic beverages.  High-fiber foods, such as raw fruits and vegetables, nuts, seeds, and whole grain breads and cereals.  Foods and beverages sweetened with sugar alcohols, such as xylitol, sorbitol, and mannitol.  Some foods may be well tolerated and may help thicken stool including:  Starchy foods, such as rice, toast, pasta, low-sugar cereal, oatmeal, grits, baked potatoes, crackers, and bagels.  Bananas.  Applesauce.  Add probiotic-rich foods to help increase healthy bacteria in the GI tract, such as yogurt and fermented milk products.  Wash your hands well after each diarrhea episode.  Only take over-the-counter or prescription medicines as directed by your caregiver.  Take a warm bath to relieve any burning or pain from frequent diarrhea episodes. SEEK IMMEDIATE MEDICAL CARE IF:   You are unable to keep fluids down.  You have persistent vomiting.  You have blood in your stool, or your stools are black and tarry.  You do not urinate in 6-8 hours, or there is only a small amount of very dark urine.  You have abdominal pain that increases or  localizes.  You have weakness, dizziness, confusion, or light-headedness.  You have a severe headache.  Your diarrhea gets worse or does not get better.  You have a fever or persistent symptoms for more than 2-3 days.  You have a fever and your symptoms suddenly get  worse. MAKE SURE YOU:   Understand these instructions.  Will watch your condition.  Will get help right away if you are not doing well or get worse.   This information is not intended to replace advice given to you by your health care provider. Make sure you discuss any questions you have with your health care provider.   Document Released: 01/09/2002 Document Revised: 02/09/2014 Document Reviewed: 09/27/2011 Elsevier Interactive Patient Education 2016 [place] [employer]  Get These Tests 1. Blood Pressure- Have your blood pressure checked once a year by your health care provider.  Normal blood pressure is 120/80. 2. Weight- Have your body mass index (BMI) calculated to screen for obesity.  BMI is measure of body fat based on height and weight.  You can also calculate your own BMI at GravelBags.it. 3. Cholesterol- Have your cholesterol checked every 5 years starting at age 25 then yearly starting at age 77. 99. Chlamydia, HIV, and other sexually transmitted diseases- Get screened every year until age 64, then within three months of each new sexual provider. 5. Pap Test - Every 1-5 years; discuss with your health care provider. 6. Mammogram- Every 1-2 years starting at age 26--50  Take these medicines  Calcium with Vitamin D-Your body needs 1200 mg of Calcium each day and [phone] IU of Vitamin D daily.  Your body can only absorb 500 mg of Calcium at a time so Calcium must be taken in 2 or 3 divided doses throughout the day.  Multivitamin with folic acid- Once daily if it is possible for you to become pregnant.  Get these Immunizations  Gardasil-Series of three doses; prevents HPV related illness such as genital warts and cervical cancer.  Menactra-Single dose; prevents meningitis.  Tetanus shot- Every 10 years.  Flu shot-Every year.  Take these steps 1. Do not smoke-Your healthcare provider can help you quit.  For tips on how to quit go to  www.smokefree.gov or call 1-800 QUITNOW. 2. Be physically active- Exercise 5 days a week for at least 30 minutes.  If you are not already physically active, start slow and gradually work up to 30 minutes of moderate physical activity.  Examples of moderate activity include walking briskly, dancing, swimming, bicycling, etc. 3. Breast Cancer- A self breast exam every month is important for early detection of breast cancer.  For more information and instruction on self breast exams, ask your healthcare provider or https://www.patel.info/. 4. Eat a healthy diet- Eat a variety of healthy foods such as fruits, vegetables, whole grains, low fat milk, low fat cheeses, yogurt, lean meats, poultry and fish, beans, nuts, tofu, etc.  For more information go to www. Thenutritionsource.org 5. Drink alcohol in moderation- Limit alcohol intake to one drink or less per day. Never drink and drive. 6. Depression- Your emotional health is as important as your physical health.  If you're feeling down or losing interest in things you normally enjoy please talk to your healthcare provider about being screened for depression. 7. Dental visit- Brush and floss your teeth twice daily; visit your dentist twice a year. 8. Eye doctor- Get an eye exam at least every 2 years. 9. Helmet use- Always wear a helmet when riding  a bicycle, motorcycle, rollerblading or skateboarding. 10. Safe sex- If you may be exposed to sexually transmitted infections, use a condom. 11. Seat belts- Seat belts can save your live; always wear one. 12. Smoke/Carbon Monoxide detectors- These detectors need to be installed on the appropriate level of your home. Replace batteries at least once a year. 13. Skin cancer- When out in the sun please cover up and use sunscreen 15 SPF or higher. 14. Violence- If anyone is threatening or hurting you, please tell your healthcare provider.

## 2015-10-29 NOTE — Progress Notes (Signed)
Patient ID: Ashlee Davis, female   DOB: 02-Dec-1965, 50 y.o.   MRN: [id number]   Subjective:  By signing my name below, I, Ashlee Davis, attest that this documentation has been prepared under the direction and in the presence of Reginia Forts, MD Electronically Signed: Ladene Artist, ED Scribe 11/23/2015 at 4:54 PM.   Patient ID: Ashlee Davis, female    DOB: [birth year], 50 y.o.   MRN: [id number]  10/29/2015  Abdominal Pain (x 2-3 days ago); Diarrhea; Flu Vaccine (discuss with patient, she wasn't sure); and Annual Exam  HPI HPI Comments: Ashlee Davis is a 50 y.o. female who presents to the [facility] for an annual exam. Pt presents with intermittent abdominal pain for the past 2-3 days. She also reports associated symptoms of 2 episodes of yellow colored diarrhea daily, belching and fluctuance. She states that she has been eating soup, noodles, beans,cabbage and potatoes. Pt has tried imodium without relief. She denies fever, chills, diaphoresis, nausea, emesis and blood in stools.   Depression screen [facility] 2/9 10/29/2015 01/17/2014  Decreased Interest 0 0  Down, Depressed, Hopeless 0 0  PHQ - 2 Score 0 0   Immunization History  Administered Date(s) Administered  . Tdap 07/01/2012    Review of Systems  Constitutional: Negative for activity change, appetite change, chills, diaphoresis, fatigue, fever and unexpected weight change.  HENT: Negative for congestion, dental problem, drooling, ear discharge, ear pain, facial swelling, hearing loss, mouth sores, nosebleeds, postnasal drip, rhinorrhea, sinus pressure, sneezing, sore throat, tinnitus, trouble swallowing and voice change.   Eyes: Negative for photophobia, pain, discharge, redness, itching and visual disturbance.  Respiratory: Negative for apnea, cough, choking, chest tightness, shortness of breath, wheezing and stridor.   Cardiovascular: Negative for chest pain, palpitations and leg swelling.    Gastrointestinal: Positive for abdominal pain and diarrhea. Negative for abdominal distention, anal bleeding, blood in stool, constipation, nausea, rectal pain and vomiting.  Endocrine: Negative for cold intolerance, heat intolerance, polydipsia, polyphagia and polyuria.  Genitourinary: Negative for decreased urine volume, difficulty urinating, dyspareunia, dysuria, enuresis, flank pain, frequency, genital sores, hematuria, menstrual problem, pelvic pain, urgency, vaginal bleeding, vaginal discharge and vaginal pain.  Musculoskeletal: Negative for arthralgias, back pain, gait problem, joint swelling, myalgias, neck pain and neck stiffness.  Skin: Negative for color change, pallor, rash and wound.  Allergic/Immunologic: Negative for environmental allergies, food allergies and immunocompromised state.  Neurological: Negative for dizziness, tremors, seizures, syncope, facial asymmetry, speech difficulty, weakness, light-headedness, numbness and headaches.  Hematological: Negative for adenopathy. Does not bruise/bleed easily.  Psychiatric/Behavioral: Negative for agitation, behavioral problems, confusion, decreased concentration, dysphoric mood, hallucinations, self-injury, sleep disturbance and suicidal ideas. The patient is not nervous/anxious and is not hyperactive.     Past Medical History:  Diagnosis Date  . Allergy   . Arthritis   . Nevus   . Other and unspecified hyperlipidemia    History reviewed. No pertinent surgical history. Allergies  Allergen Reactions  . Wine [Alcohol]    Current Outpatient Prescriptions  Medication Sig Dispense Refill  . aspirin 81 MG tablet Take 81 mg by mouth daily.    . benzonatate (TESSALON) 100 MG capsule Take 1-2 capsules (100-200 mg total) by mouth 3 (three) times daily as needed. (Patient not taking: Reported on 10/29/2015) 30 capsule 0  . HYDROcodone-homatropine (HYCODAN) 5-1.5 MG/5ML syrup Take 5 mLs by mouth every 4 (four) hours as needed. (Patient  not taking: Reported on 10/29/2015) 120 mL 0   No current facility-administered medications for  this visit.    Social History   Social History  . Marital status: Married    Spouse name: Ferninand  . Number of children: 0  . Years of education: 12   Occupational History  . homemaker    Social History Main Topics  . Smoking status: Never Smoker  . Smokeless tobacco: Never Used  . Alcohol use No  . Drug use: No  . Sexual activity: Yes    Partners: Male   Other Topics Concern  . Not on file   Social History Narrative   Marital status: married since 1999.  Happily married; no abuse.      Children:  None      Lives: with husband; from [place]; moved to [place] to [place].   From the [place], left at age 59 for the [place].      Tobacco: never      Alcohol: socially but rare due to skin flushing.      Exercises:  Walking every morning; works in garden a lot.   Always uses seat belts. Smoke alarm in the home. No guns in the home.   Caffeine use: in moderation daily.   Family History  Problem Relation Age of Onset  . Hypertension Mother   . Heart disease Mother 43    AMI   . Hypertension Father        Objective:    BP (!) 134/102 (BP Location: Left Arm, Patient Position: Sitting, Cuff Size: Normal)   Pulse 85   Temp 98.5 F (36.9 C)   Resp 16   Ht 5' 0.5" (1.537 m)   Wt 135 lb (61.2 kg)   LMP 10/22/2015 (Approximate)   SpO2 98%   BMI 25.93 kg/m  Physical Exam  Constitutional: She is oriented to person, place, and time. She appears well-developed and well-nourished. No distress.  HENT:  Head: Normocephalic and atraumatic.  Right Ear: External ear normal.  Left Ear: External ear normal.  Nose: Nose normal.  Mouth/Throat: Oropharynx is clear and moist.  Eyes: Conjunctivae and EOM are normal. Pupils are equal, round, and reactive to light.  Neck: Normal range of motion and full passive range of motion without pain. Neck supple. No JVD present. Carotid bruit is  not present. No thyromegaly present.  Cardiovascular: Normal rate, regular rhythm, normal heart sounds and intact distal pulses.  Exam reveals no gallop and no friction rub.   No murmur heard. Pulmonary/Chest: Effort normal and breath sounds normal. She has no wheezes. She has no rales. Right breast exhibits no inverted nipple, no mass, no nipple discharge, no skin change and no tenderness. Left breast exhibits no inverted nipple, no mass, no nipple discharge, no skin change and no tenderness. Breasts are symmetrical.  Abdominal: Soft. Bowel sounds are normal. She exhibits no distension and no mass. There is no tenderness. There is no rebound and no guarding.  Genitourinary: Vagina normal. No breast tenderness.  Genitourinary Comments: Chaperone present. Normal breast exam. Normal pelvic exam.   Musculoskeletal:       Right shoulder: Normal.       Left shoulder: Normal.       Cervical back: Normal.  Lymphadenopathy:    She has no cervical adenopathy.  Neurological: She is alert and oriented to person, place, and time. She has normal reflexes. No cranial nerve deficit. She exhibits normal muscle tone. Coordination normal.  Skin: Skin is warm and dry. No rash noted. She is not diaphoretic. No erythema. No pallor.  Psychiatric: She  has a normal mood and affect. Her behavior is normal. Judgment and thought content normal.  Nursing note and vitals reviewed.       Assessment & Plan:   1. Routine physical examination   2. Cervical cancer screening   3. Diarrhea of presumed infectious origin   4. Pure hypercholesterolemia   5. Screening for HIV (human immunodeficiency virus)    -obtain pap smear. -obtain stool studies due to recent travel. -BRAT diet; hydration. -obtain age appropriate screening labs. -pt refuses any immunizations.   Orders Placed This Encounter  Procedures  . Ova and parasite examination  . Clostridium Difficile by PCR    Order Specific Question:   Is your patient  experiencing loose or watery stools (3 or more in 24 hours)?    Answer:   Yes    Order Specific Question:   Has the patient received laxatives in the last 24 hours?    Answer:   No    Order Specific Question:   Has a negative Cdiff test resulted in the last 7 days?    Answer:   No  . Stool culture  . POCT urinalysis dipstick   No orders of the defined types were placed in this encounter.   No Follow-up on file.   I personally performed the services described in this documentation, which was scribed in my presence. The recorded information has been reviewed and considered.  Sammie Denner Elayne Guerin, M.D. [facility] [address] [place], [place]  [address] [phone] phone [phone] fax

## 2015-10-30 ENCOUNTER — Other Ambulatory Visit (INDEPENDENT_AMBULATORY_CARE_PROVIDER_SITE_OTHER): Payer: BLUE CROSS/BLUE SHIELD | Admitting: Family Medicine

## 2015-10-30 DIAGNOSIS — A09 Infectious gastroenteritis and colitis, unspecified: Secondary | ICD-10-CM | POA: Diagnosis not present

## 2015-10-30 DIAGNOSIS — E7800 Pure hypercholesterolemia, unspecified: Secondary | ICD-10-CM | POA: Diagnosis not present

## 2015-10-30 LAB — COMPREHENSIVE METABOLIC PANEL
ALBUMIN: 4.4 g/dL (ref 3.6–5.1)
ALT: 10 U/L (ref 6–29)
AST: 16 U/L (ref 10–35)
Alkaline Phosphatase: 55 U/L (ref 33–115)
BILIRUBIN TOTAL: 1.6 mg/dL — AB (ref 0.2–1.2)
BUN: 12 mg/dL (ref 7–25)
CALCIUM: 9.4 mg/dL (ref 8.6–10.2)
CO2: 21 mmol/L (ref 20–31)
CREATININE: 0.6 mg/dL (ref 0.50–1.10)
Chloride: 105 mmol/L (ref 98–110)
Glucose, Bld: 100 mg/dL — ABNORMAL HIGH (ref 65–99)
Potassium: 4 mmol/L (ref 3.5–5.3)
SODIUM: 138 mmol/L (ref 135–146)
TOTAL PROTEIN: 7.1 g/dL (ref 6.1–8.1)

## 2015-10-30 LAB — CBC WITH DIFFERENTIAL/PLATELET
BASOS ABS: 0 {cells}/uL (ref 0–200)
Basophils Relative: 0 %
EOS PCT: 2 %
Eosinophils Absolute: 136 cells/uL (ref 15–500)
HEMATOCRIT: 41.6 % (ref 35.0–45.0)
HEMOGLOBIN: 14.6 g/dL (ref 11.7–15.5)
LYMPHS ABS: 2312 {cells}/uL (ref 850–3900)
Lymphocytes Relative: 34 %
MCH: 32 pg (ref 27.0–33.0)
MCHC: 35.1 g/dL (ref 32.0–36.0)
MCV: 91.2 fL (ref 80.0–100.0)
MONO ABS: 340 {cells}/uL (ref 200–950)
MPV: 8.5 fL (ref 7.5–12.5)
Monocytes Relative: 5 %
NEUTROS ABS: 4012 {cells}/uL (ref 1500–7800)
Neutrophils Relative %: 59 %
Platelets: 242 10*3/uL (ref 140–400)
RBC: 4.56 MIL/uL (ref 3.80–5.10)
RDW: 13.4 % (ref 11.0–15.0)
WBC: 6.8 10*3/uL (ref 3.8–10.8)

## 2015-10-30 LAB — LIPID PANEL
CHOLESTEROL: 220 mg/dL — AB (ref 125–200)
HDL: 46 mg/dL (ref >=46)
LDL Cholesterol: 148 mg/dL — ABNORMAL HIGH (ref <130)
TRIGLYCERIDES: 132 mg/dL (ref <150)
Total CHOL/HDL Ratio: 4.8 Ratio (ref <=5.0)
VLDL: 26 mg/dL (ref <30)

## 2015-10-30 LAB — TSH: TSH: 1.27 mIU/L

## 2015-10-31 DIAGNOSIS — A09 Infectious gastroenteritis and colitis, unspecified: Secondary | ICD-10-CM | POA: Diagnosis not present

## 2015-10-31 LAB — HEMOGLOBIN A1C
Hgb A1c MFr Bld: 5.6 % (ref <5.7)
MEAN PLASMA GLUCOSE: 114 mg/dL

## 2015-10-31 LAB — CLOSTRIDIUM DIFFICILE BY PCR: CDIFFPCR: NOT DETECTED

## 2015-11-01 LAB — OVA AND PARASITE EXAMINATION: OP: NONE SEEN

## 2015-11-02 LAB — STOOL CULTURE

## 2015-11-04 LAB — PAP IG AND HPV HIGH-RISK: HPV DNA HIGH RISK: NOT DETECTED

## 2015-11-23 NOTE — Progress Notes (Signed)
Lab visit only; no provider encounter. 

## 2015-11-27 ENCOUNTER — Ambulatory Visit (INDEPENDENT_AMBULATORY_CARE_PROVIDER_SITE_OTHER): Payer: BLUE CROSS/BLUE SHIELD | Admitting: Family Medicine

## 2015-11-27 VITALS — BP 124/100 | HR 86 | Temp 98.2°F | Resp 16 | Ht 59.5 in | Wt 133.4 lb

## 2015-11-27 DIAGNOSIS — A09 Infectious gastroenteritis and colitis, unspecified: Secondary | ICD-10-CM | POA: Diagnosis not present

## 2015-11-27 DIAGNOSIS — R197 Diarrhea, unspecified: Secondary | ICD-10-CM

## 2015-11-27 MED ORDER — METRONIDAZOLE 500 MG PO TABS: 500.0000 mg | ORAL_TABLET | Freq: Three times a day (TID) | ORAL | 0 refills | Status: DC

## 2015-11-27 MED ORDER — CIPROFLOXACIN HCL 500 MG PO TABS: 500.0000 mg | ORAL_TABLET | Freq: Two times a day (BID) | ORAL | 0 refills | Status: DC

## 2015-11-27 NOTE — Progress Notes (Signed)
Subjective:    Patient ID: Ashlee Davis, female    DOB: [birth year], 50 y.o.   MRN: [id number]  11/27/2015  Follow-up (LLQ Abdominal pain)   HPI This 50 y.o. female presents for evaluation of recurrent diarrhea, abdominal pain. Recurrent yesterday; diarrhea with green mucous.  Improved from two weeks ago when evaluated in office.  Then two weeks, went to [place]; ate greens with recurrence.  Stool studies from last visit 10/30/15 WNL.   Leaving for [facility].  Wants GI referral.  No fever/chills/sweats.  With eating, has LLQ pain.  Pain is milder today.  Has not eaten anything today; afraid to eat.  +belching a lot.  Avoids eating in the morning; still belching.  No nausea; no vomiting.  Eating lemons to enhance lemons.  Leaving for [facility] next week.  Wants GI appointment November 13 and 14.  Had three diarrhea yesterday; today had 2 stools/diarrhea.  Worse when eating greens.  Does not eat potatoes.  Non-bloody stools; non-mucous.  No history of colonoscopy.   Review of Systems  Constitutional: Negative for chills, diaphoresis, fatigue and fever.  Eyes: Negative for visual disturbance.  Respiratory: Negative for cough and shortness of breath.   Cardiovascular: Negative for chest pain, palpitations and leg swelling.  Gastrointestinal: Positive for abdominal distention, abdominal pain and diarrhea. Negative for anal bleeding, blood in stool, constipation, nausea, rectal pain and vomiting.  Endocrine: Negative for cold intolerance, heat intolerance, polydipsia, polyphagia and polyuria.  Neurological: Negative for dizziness, tremors, seizures, syncope, facial asymmetry, speech difficulty, weakness, light-headedness, numbness and headaches.    Past Medical History:  Diagnosis Date  . Allergy   . Arthritis   . Nevus   . Other and unspecified hyperlipidemia    No past surgical history on file. Allergies  Allergen Reactions  . Wine [Alcohol]    Current Outpatient  Prescriptions  Medication Sig Dispense Refill  . aspirin 81 MG tablet Take 81 mg by mouth daily.    . ciprofloxacin (CIPRO) 500 MG tablet Take 1 tablet (500 mg total) by mouth 2 (two) times daily. 14 tablet 0  . metroNIDAZOLE (FLAGYL) 500 MG tablet Take 1 tablet (500 mg total) by mouth 3 (three) times daily. DO NOT CONSUME ALCOHOL WHILE TAKING THIS MEDICATION. 21 tablet 0   No current facility-administered medications for this visit.    Social History   Social History  . Marital status: Married    Spouse name: Ferninand  . Number of children: 0  . Years of education: 12   Occupational History  . homemaker    Social History Main Topics  . Smoking status: Never Smoker  . Smokeless tobacco: Never Used  . Alcohol use No  . Drug use: No  . Sexual activity: Yes    Partners: Male   Other Topics Concern  . Not on file   Social History Narrative   Marital status: married since 1999.  Happily married; no abuse.      Children:  None      Lives: with husband; from [place]; moved to [place] to [place].   From the [place], left at age 66 for the [place].      Tobacco: never      Alcohol: socially but rare due to skin flushing.      Exercises:  Walking every morning; works in garden a lot.   Always uses seat belts. Smoke alarm in the home. No guns in the home.   Caffeine use: in moderation daily.  Family History  Problem Relation Age of Onset  . Hypertension Mother   . Heart disease Mother 67    AMI   . Hypertension Father        Objective:    BP (!) 124/100 (BP Location: Right Arm, Patient Position: Sitting, Cuff Size: Normal)   Pulse 86   Temp 98.2 F (36.8 C) (Oral)   Resp 16   Ht 4' 11.5" (1.511 m)   Wt 133 lb 6.4 oz (60.5 kg)   LMP 10/22/2015 (Approximate)   SpO2 98%   BMI 26.49 kg/m  Physical Exam  Constitutional: She is oriented to person, place, and time. She appears well-developed and well-nourished. No distress.  HENT:  Head: Normocephalic and  atraumatic.  Right Ear: External ear normal.  Left Ear: External ear normal.  Nose: Nose normal.  Mouth/Throat: Oropharynx is clear and moist.  Eyes: Conjunctivae and EOM are normal. Pupils are equal, round, and reactive to light.  Neck: Normal range of motion. Neck supple. Carotid bruit is not present. No thyromegaly present.  Cardiovascular: Normal rate, regular rhythm, normal heart sounds and intact distal pulses.  Exam reveals no gallop and no friction rub.   No murmur heard. Pulmonary/Chest: Effort normal and breath sounds normal. She has no wheezes. She has no rales.  Abdominal: Soft. Bowel sounds are normal. She exhibits no distension and no mass. There is no tenderness. There is no rebound and no guarding.  Lymphadenopathy:    She has no cervical adenopathy.  Neurological: She is alert and oriented to person, place, and time. No cranial nerve deficit.  Skin: Skin is warm and dry. No rash noted. She is not diaphoretic. No erythema. No pallor.  Psychiatric: She has a normal mood and affect. Her behavior is normal.   Results for orders placed or performed in visit on 10/30/15  CBC with Differential/Platelet  Result Value Ref Range   WBC 6.8 3.8 - 10.8 K/uL   RBC 4.56 3.80 - 5.10 MIL/uL   Hemoglobin 14.6 11.7 - 15.5 g/dL   HCT 41.6 35.0 - 45.0 %   MCV 91.2 80.0 - 100.0 fL   MCH 32.0 27.0 - 33.0 pg   MCHC 35.1 32.0 - 36.0 g/dL   RDW 13.4 11.0 - 15.0 %   Platelets 242 140 - 400 K/uL   MPV 8.5 7.5 - 12.5 fL   Neutro Abs 4,012 1,500 - 7,800 cells/uL   Lymphs Abs 2,312 850 - 3,900 cells/uL   Monocytes Absolute 340 200 - 950 cells/uL   Eosinophils Absolute 136 15 - 500 cells/uL   Basophils Absolute 0 0 - 200 cells/uL   Neutrophils Relative % 59 %   Lymphocytes Relative 34 %   Monocytes Relative 5 %   Eosinophils Relative 2 %   Basophils Relative 0 %   Smear Review Criteria for review not met   Comprehensive metabolic panel  Result Value Ref Range   Sodium 138 135 - 146 mmol/L    Potassium 4.0 3.5 - 5.3 mmol/L   Chloride 105 98 - 110 mmol/L   CO2 21 20 - 31 mmol/L   Glucose, Bld 100 (H) 65 - 99 mg/dL   BUN 12 7 - 25 mg/dL   Creat 0.60 0.50 - 1.10 mg/dL   Total Bilirubin 1.6 (H) 0.2 - 1.2 mg/dL   Alkaline Phosphatase 55 33 - 115 U/L   AST 16 10 - 35 U/L   ALT 10 6 - 29 U/L   Total Protein 7.1 6.1 -   8.1 g/dL   Albumin 4.4 3.6 - 5.1 g/dL   Calcium 9.4 8.6 - 10.2 mg/dL  Lipid panel  Result Value Ref Range   Cholesterol 220 (H) 125 - 200 mg/dL   Triglycerides 132 <150 mg/dL   HDL 46 >=46 mg/dL   Total CHOL/HDL Ratio 4.8 <=5.0 Ratio   VLDL 26 <30 mg/dL   LDL Cholesterol 148 (H) <130 mg/dL  TSH  Result Value Ref Range   TSH 1.27 mIU/L  Hemoglobin A1c  Result Value Ref Range   Hgb A1c MFr Bld 5.6 <5.7 %   Mean Plasma Glucose 114 mg/dL       Assessment & Plan:   1. Diarrhea of presumed infectious origin    -recurrent; refer to GI. -due to recurrence, treat with Cipro and Flagyl.  -benign abdominal exam in office.   Orders Placed This Encounter  Procedures  . Ambulatory referral to Gastroenterology    Referral Priority:   Routine    Referral Type:   Consultation    Referral Reason:   Specialty Services Required    Number of Visits Requested:   1   Meds ordered this encounter  Medications  . ciprofloxacin (CIPRO) 500 MG tablet    Sig: Take 1 tablet (500 mg total) by mouth 2 (two) times daily.    Dispense:  14 tablet    Refill:  0  . metroNIDAZOLE (FLAGYL) 500 MG tablet    Sig: Take 1 tablet (500 mg total) by mouth 3 (three) times daily. DO NOT CONSUME ALCOHOL WHILE TAKING THIS MEDICATION.    Dispense:  21 tablet    Refill:  0    No Follow-up on file.   Kristi Martin Smith, M.D. [facility]  [place] [address] [place], [place]  [address] [phone] phone [phone] fax  

## 2015-11-27 NOTE — Patient Instructions (Addendum)
       IF you received an x-ray today, you will receive an invoice from [facility]. Please contact [facility] at [phone] with questions or concerns regarding your invoice.   IF you received labwork today, you will receive an invoice from [facility]. Please contact [facility] at [phone] with questions or concerns regarding your invoice.   Our billing staff will not be able to assist you with questions regarding bills from these companies.  You will be contacted with the lab results as soon as they are available. The fastest way to get your results is to activate your My Chart account. Instructions are located on the last page of this paperwork. If you have not heard from us regarding the results in 2 weeks, please contact this office.     Diarrhea Diarrhea is frequent loose and watery bowel movements. It can cause you to feel weak and dehydrated. Dehydration can cause you to become tired and thirsty, have a dry mouth, and have decreased urination that often is dark yellow. Diarrhea is a sign of another problem, most often an infection that will not last long. In most cases, diarrhea typically lasts 2-3 days. However, it can last longer if it is a sign of something more serious. It is important to treat your diarrhea as directed by your caregiver to lessen or prevent future episodes of diarrhea. CAUSES  Some common causes include:  Gastrointestinal infections caused by viruses, bacteria, or parasites.  Food poisoning or food allergies.  Certain medicines, such as antibiotics, chemotherapy, and laxatives.  Artificial sweeteners and fructose.  Digestive disorders. HOME CARE INSTRUCTIONS  Ensure adequate fluid intake (hydration): Have 1 cup (8 oz) of fluid for each diarrhea episode. Avoid fluids that contain simple sugars or sports drinks, fruit juices, whole milk products, and sodas. Your urine should be clear or pale yellow if you  are drinking enough fluids. Hydrate with an oral rehydration solution that you can purchase at pharmacies, retail stores, and online. You can prepare an oral rehydration solution at home by mixing the following ingredients together:   - tsp table salt.   tsp baking soda.   tsp salt substitute containing potassium chloride.  1  tablespoons sugar.  1 L (34 oz) of water.  Certain foods and beverages may increase the speed at which food moves through the gastrointestinal (GI) tract. These foods and beverages should be avoided and include:  Caffeinated and alcoholic beverages.  High-fiber foods, such as raw fruits and vegetables, nuts, seeds, and whole grain breads and cereals.  Foods and beverages sweetened with sugar alcohols, such as xylitol, sorbitol, and mannitol.  Some foods may be well tolerated and may help thicken stool including:  Starchy foods, such as rice, toast, pasta, low-sugar cereal, oatmeal, grits, baked potatoes, crackers, and bagels.  Bananas.  Applesauce.  Add probiotic-rich foods to help increase healthy bacteria in the GI tract, such as yogurt and fermented milk products.  Wash your hands well after each diarrhea episode.  Only take over-the-counter or prescription medicines as directed by your caregiver.  Take a warm bath to relieve any burning or pain from frequent diarrhea episodes. SEEK IMMEDIATE MEDICAL CARE IF:   You are unable to keep fluids down.  You have persistent vomiting.  You have blood in your stool, or your stools are black and tarry.  You do not urinate in 6-8 hours, or there is only a small amount of very dark urine.  You have abdominal   pain that increases or localizes.  You have weakness, dizziness, confusion, or light-headedness.  You have a severe headache.  Your diarrhea gets worse or does not get better.  You have a fever or persistent symptoms for more than 2-3 days.  You have a fever and your symptoms suddenly get  worse. MAKE SURE YOU:   Understand these instructions.  Will watch your condition.  Will get help right away if you are not doing well or get worse.   This information is not intended to replace advice given to you by your health care provider. Make sure you discuss any questions you have with your health care provider.   Document Released: 01/09/2002 Document Revised: 02/09/2014 Document Reviewed: 09/27/2011 Elsevier Interactive Patient Education 2016 Elsevier Inc.  

## 2015-12-02 ENCOUNTER — Encounter: Payer: Self-pay | Admitting: Internal Medicine

## 2016-02-05 ENCOUNTER — Ambulatory Visit (INDEPENDENT_AMBULATORY_CARE_PROVIDER_SITE_OTHER): Payer: BLUE CROSS/BLUE SHIELD | Admitting: Internal Medicine

## 2016-02-05 ENCOUNTER — Encounter: Payer: Self-pay | Admitting: Internal Medicine

## 2016-02-05 VITALS — BP 136/96 | HR 80 | Ht 59.5 in | Wt 134.4 lb

## 2016-02-05 DIAGNOSIS — R197 Diarrhea, unspecified: Secondary | ICD-10-CM | POA: Diagnosis not present

## 2016-02-05 MED ORDER — NA SULFATE-K SULFATE-MG SULF 17.5-3.13-1.6 GM/177ML PO SOLN: 1.0000 | Freq: Once | ORAL | 0 refills | Status: AC

## 2016-02-05 NOTE — Progress Notes (Signed)
HISTORY OF PRESENT ILLNESS:  Ashlee Davis is a 51 y.o. female , [place] native, who is referred today by her primary care provider Dr. Tamala Julian regarding chronic diarrhea. The patient is accompanied by her husband. Patient reports being in her usual state of health until around September 2017 when after traveling to [place] develop problems with diarrhea. Initially 5 times per day including a nocturnal component. She underwent evaluation including negative stool studies for ova and parasite, Clostridium difficile, and enteric pathogens. Multiple general laboratories were unremarkable. Treated empirically with antibiotics which did not help. As time has gone on her symptoms have improved though not baseline. Currently with proximally to loose or soft stools daily. No urgency. No medications or OTC agents. She describes "left lower quadrant pain". Actually it is pain over the region of her left lateral hip as she has this marked with an ink pen. He reports that vegetables and cabbage make her stools loose. She describes green color. She's quite concerned. She wants colonoscopy. She has had no weight loss, bleeding, fevers, or true abdominal pain.  REVIEW OF SYSTEMS:  All non-GI ROS negative except for arthritis  Past Medical History:  Diagnosis Date  . Allergy   . Arthritis   . Hypertension   . Nevus   . Other and unspecified hyperlipidemia     Past Surgical History:  Procedure Laterality Date  . NONE      Social History Ashlee Davis  reports that she has never smoked. She has never used smokeless tobacco. She reports that she drinks alcohol. She reports that she does not use drugs.  family history includes Heart disease (age of onset: 3) in her mother; Hypertension in her mother.  Allergies  Allergen Reactions  . Wine [Alcohol]        PHYSICAL EXAMINATION: Vital signs: BP (!) 136/96   Pulse 80   Ht 4' 11.5" (1.511 m)   Wt 134 lb 6 oz (61 kg)   BMI 26.69 kg/m    Constitutional: generally well-appearing, no acute distress Psychiatric: alert and oriented x3, cooperative Eyes: extraocular movements intact, anicteric, conjunctiva pink Mouth: oral pharynx moist, no lesions Neck: supple no lymphadenopathy Cardiovascular: heart regular rate and rhythm, no murmur Lungs: clear to auscultation bilaterally Abdomen: soft, nontender, nondistended, no obvious ascites, no peritoneal signs, normal bowel sounds, no organomegaly Rectal:Deferred until colonoscopy Extremities: no clubbing cyanosis or lower extremity edema bilaterally Skin: no lesions on visible extremities Neuro: No focal deficits. Cranial nerves intact  ASSESSMENT:  #1. Problems with diarrhea as described. Gradually improving. 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 Rule out microscopic colitis. No alarm features. Approaching age for colon cancer screening shortly #2. Left hip pain. Not abdominal pain  PLAN:  #1. Schedule colonoscopy with biopsies. This to evaluate chronic loose stools and provide colon cancer screening.The nature of the procedure, as well as the risks, benefits, and alternatives were carefully and thoroughly reviewed with the patient. Ample time for discussion and questions allowed. The patient understood, was satisfied, and agreed to proceed. #2. Return to PCP for left hip pain  A copy of this consultation note has been sent to Dr. Tamala Julian

## 2016-02-05 NOTE — Patient Instructions (Signed)
You have been scheduled for a colonoscopy. Please follow written instructions given to you at your visit today.  Please pick up your prep supplies at the pharmacy within the next 1-3 days. If you use inhalers (even only as needed), please bring them with you on the day of your procedure.   

## 2016-02-18 ENCOUNTER — Encounter: Payer: Self-pay | Admitting: Family Medicine

## 2016-02-18 ENCOUNTER — Ambulatory Visit (INDEPENDENT_AMBULATORY_CARE_PROVIDER_SITE_OTHER): Payer: BLUE CROSS/BLUE SHIELD | Admitting: Family Medicine

## 2016-02-18 VITALS — BP 129/87 | HR 102 | Temp 97.8°F | Resp 16 | Ht 60.0 in | Wt 134.2 lb

## 2016-02-18 DIAGNOSIS — E7800 Pure hypercholesterolemia, unspecified: Secondary | ICD-10-CM | POA: Diagnosis not present

## 2016-02-18 DIAGNOSIS — Z0000 Encounter for general adult medical examination without abnormal findings: Secondary | ICD-10-CM | POA: Diagnosis not present

## 2016-02-18 DIAGNOSIS — I10 Essential (primary) hypertension: Secondary | ICD-10-CM

## 2016-02-18 DIAGNOSIS — Z114 Encounter for screening for human immunodeficiency virus [HIV]: Secondary | ICD-10-CM | POA: Diagnosis not present

## 2016-02-18 DIAGNOSIS — K591 Functional diarrhea: Secondary | ICD-10-CM | POA: Diagnosis not present

## 2016-02-18 DIAGNOSIS — Z1239 Encounter for other screening for malignant neoplasm of breast: Secondary | ICD-10-CM

## 2016-02-18 DIAGNOSIS — Z1231 Encounter for screening mammogram for malignant neoplasm of breast: Secondary | ICD-10-CM

## 2016-02-18 LAB — POCT URINALYSIS DIP (MANUAL ENTRY)
BILIRUBIN UA: NEGATIVE
GLUCOSE UA: NEGATIVE
LEUKOCYTES UA: NEGATIVE
NITRITE UA: NEGATIVE
Protein Ur, POC: NEGATIVE
Spec Grav, UA: 1.025
Urobilinogen, UA: 0.2
pH, UA: 6

## 2016-02-18 MED ORDER — HYDROCHLOROTHIAZIDE 12.5 MG PO TABS: 12.5000 mg | ORAL_TABLET | Freq: Every day | ORAL | 1 refills | Status: DC

## 2016-02-18 MED ORDER — ATORVASTATIN CALCIUM 10 MG PO TABS: 10.0000 mg | ORAL_TABLET | Freq: Every day | ORAL | 1 refills | Status: DC

## 2016-02-18 NOTE — Patient Instructions (Addendum)
   IF you received an x-ray today, you will receive an invoice from [facility]. Please contact [facility] at [phone] with questions or concerns regarding your invoice.   IF you received labwork today, you will receive an invoice from [facility]. Please contact [facility] at [phone] with questions or concerns regarding your invoice.   Our billing staff will not be able to assist you with questions regarding bills from these companies.  You will be contacted with the lab results as soon as they are available. The fastest way to get your results is to activate your My Chart account. Instructions are located on the last page of this paperwork. If you have not heard from us regarding the results in 2 weeks, please contact this office.     Mediterranean Diet A Mediterranean diet refers to food and lifestyle choices that are based on the traditions of countries located on the [place] Sea. This way of eating has been shown to help prevent certain conditions and improve outcomes for people who have chronic diseases, like kidney disease and heart disease. What are tips for following this plan? Lifestyle  Cook and eat meals together with your family, when possible.  Drink enough fluid to keep your urine clear or pale yellow.  Be physically active every day. This includes:  Aerobic exercise like running or swimming.  Leisure activities like gardening, walking, or housework.  Get 7-8 hours of sleep each night.  If recommended by your health care provider, drink red wine in moderation. This means 1 glass a day for nonpregnant women and 2 glasses a day for men. A glass of wine equals 5 oz (150 mL). Reading food labels  Check the serving size of packaged foods. For foods such as rice and pasta, the serving size refers to the amount of cooked product, not dry.  Check the total fat in packaged foods. Avoid foods that have saturated fat or trans fats.  Check the  ingredients list for added sugars, such as corn syrup. Shopping  At the grocery store, buy most of your food from the areas near the walls of the store. This includes:  Fresh fruits and vegetables (produce).  Grains, beans, nuts, and seeds. Some of these may be available in unpackaged forms or large amounts (in bulk).  Fresh seafood.  Poultry and eggs.  Low-fat dairy products.  Buy whole ingredients instead of prepackaged foods.  Buy fresh fruits and vegetables in-season from local farmers markets.  Buy frozen fruits and vegetables in resealable bags.  If you do not have access to quality fresh seafood, buy precooked frozen shrimp or canned fish, such as tuna, salmon, or sardines.  Buy small amounts of raw or cooked vegetables, salads, or olives from the deli or salad bar at your store.  Stock your pantry so you always have certain foods on hand, such as olive oil, canned tuna, canned tomatoes, rice, pasta, and beans. Cooking  Cook foods with extra-virgin olive oil instead of using butter or other vegetable oils.  Have meat as a side dish, and have vegetables or grains as your main dish. This means having meat in small portions or adding small amounts of meat to foods like pasta or stew.  Use beans or vegetables instead of meat in common dishes like chili or lasagna.  Experiment with different cooking methods. Try roasting or broiling vegetables instead of steaming or sauteing them.  Add frozen vegetables to soups, stews, pasta, or rice.  Add nuts or seeds   for added healthy fat at each meal. You can add these to yogurt, salads, or vegetable dishes.  Marinate fish or vegetables using olive oil, lemon juice, garlic, and fresh herbs. Meal planning  Plan to eat 1 vegetarian meal one day each week. Try to work up to 2 vegetarian meals, if possible.  Eat seafood 2 or more times a week.  Have healthy snacks readily available, such as:  Vegetable sticks with hummus.  Greek  yogurt.  Fruit and nut trail mix.  Eat balanced meals throughout the week. This includes:  Fruit: 2-3 servings a day  Vegetables: 4-5 servings a day  Low-fat dairy: 2 servings a day  Fish, poultry, or lean meat: 1 serving a day  Beans and legumes: 2 or more servings a week  Nuts and seeds: 1-2 servings a day  Whole grains: 6-8 servings a day  Extra-virgin olive oil: 3-4 servings a day  Limit red meat and sweets to only a few servings a month What are my food choices?  Mediterranean diet  Recommended  Grains: Whole-grain pasta. Brown rice. Bulgar wheat. Polenta. Couscous. Whole-wheat bread. Oatmeal. Quinoa.  Vegetables: Artichokes. Beets. Broccoli. Cabbage. Carrots. Eggplant. Green beans. Chard. Kale. Spinach. Onions. Leeks. Peas. Squash. Tomatoes. Peppers. Radishes.  Fruits: Apples. Apricots. Avocado. Berries. Bananas. Cherries. Dates. Figs. Grapes. Lemons. Melon. Oranges. Peaches. Plums. Pomegranate.  Meats and other protein foods: Beans. Almonds. Sunflower seeds. Pine nuts. Peanuts. Cod. Salmon. Scallops. Shrimp. Tuna. Tilapia. Clams. Oysters. Eggs.  Dairy: Low-fat milk. Cheese. Greek yogurt.  Beverages: Water. Red wine. Herbal tea.  Fats and oils: Extra virgin olive oil. Avocado oil. Grape seed oil.  Sweets and desserts: Greek yogurt with honey. Baked apples. Poached pears. Trail mix.  Seasoning and other foods: Basil. Cilantro. Coriander. Cumin. Mint. Parsley. Sage. Rosemary. Tarragon. Garlic. Oregano. Thyme. Pepper. Balsalmic vinegar. Tahini. Hummus. Tomato sauce. Olives. Mushrooms.  Limit these  Grains: Prepackaged pasta or rice dishes. Prepackaged cereal with added sugar.  Vegetables: Deep fried potatoes (french fries).  Fruits: Fruit canned in syrup.  Meats and other protein foods: Beef. Pork. Lamb. Poultry with skin. Hot dogs. Bacon.  Dairy: Ice cream. Sour cream. Whole milk.  Beverages: Juice. Sugar-sweetened soft drinks. Beer. Liquor and  spirits.  Fats and oils: Butter. Canola oil. Vegetable oil. Beef fat (tallow). Lard.  Sweets and desserts: Cookies. Cakes. Pies. Candy.  Seasoning and other foods: Mayonnaise. Premade sauces and marinades.  The items listed may not be a complete list. Talk with your dietitian about what dietary choices are right for you. Summary  The Mediterranean diet includes both food and lifestyle choices.  Eat a variety of fresh fruits and vegetables, beans, nuts, seeds, and whole grains.  Limit the amount of red meat and sweets that you eat.  Talk with your health care provider about whether it is safe for you to drink red wine in moderation. This means 1 glass a day for nonpregnant women and 2 glasses a day for men. A glass of wine equals 5 oz (150 mL). This information is not intended to replace advice given to you by your health care provider. Make sure you discuss any questions you have with your health care provider. Document Released: 09/12/2015 Document Revised: 10/15/2015 Document Reviewed: 09/12/2015 Elsevier Interactive Patient Education  2017 Elsevier Inc.  

## 2016-02-18 NOTE — Progress Notes (Signed)
Subjective:    Patient ID: Ashlee Davis, female    DOB: 1965-07-24, 51 y.o.   MRN: [id number]  02/18/2016  Annual Exam (with pap smear)   HPI This 51 y.o. female presents for follow-up of hypercholesterolemia, diarrhea.  Could not tolerate abx.  Scheduled for colonoscopy on 03/23/16.  When eats gluten free, blood pressure goes to 130/90.   Requesting cholesterol medication to prevent AMI/CAD.  Blood pressure elevates when eating gluten free.  Checking BP at home; running 130/100.   Strong family history of CVA and hypertension and hypercholesterolemia.  Immunization History  Administered Date(s) Administered  . Tdap 07/01/2012   BP Readings from Last 3 Encounters:  02/18/16 129/87  02/05/16 (!) 136/96  11/27/15 (!) 124/100   Wt Readings from Last 3 Encounters:  02/18/16 134 lb 3.2 oz (60.9 kg)  02/05/16 134 lb 6 oz (61 kg)  11/27/15 133 lb 6.4 oz (60.5 kg)    Review of Systems  Constitutional: Negative for chills, diaphoresis, fatigue and fever.  Eyes: Negative for visual disturbance.  Respiratory: Negative for cough and shortness of breath.   Cardiovascular: Negative for chest pain, palpitations and leg swelling.  Gastrointestinal: Negative for abdominal pain, constipation, diarrhea, nausea and vomiting.  Endocrine: Negative for cold intolerance, heat intolerance, polydipsia, polyphagia and polyuria.  Neurological: Negative for dizziness, tremors, seizures, syncope, facial asymmetry, speech difficulty, weakness, light-headedness, numbness and headaches.    Past Medical History:  Diagnosis Date  . Allergy   . Arthritis   . Hypertension   . Nevus   . Other and unspecified hyperlipidemia    Past Surgical History:  Procedure Laterality Date  . NONE     Allergies  Allergen Reactions  . Wine [Alcohol]     Social History   Social History  . Marital status: Married    Spouse name: Ferninand  . Number of children: 0  . Years of education: 12   Occupational  History  . homemaker    Social History Main Topics  . Smoking status: Never Smoker  . Smokeless tobacco: Never Used  . Alcohol use Yes     Comment: wine occasionally  . Drug use: No  . Sexual activity: Yes    Partners: Male   Other Topics Concern  . Not on file   Social History Narrative   Marital status: married since 1999.  Happily married; no abuse.      Children:  None      Lives: with husband; from [place]; moved to [place] to [place].   From the [place], left at age 63 for the [place].      Tobacco: never      Alcohol: socially but rare due to skin flushing.      Exercises:  Walking every morning; works in garden a lot.   Always uses seat belts. Smoke alarm in the home. No guns in the home.   Caffeine use: in moderation daily.   Family History  Problem Relation Age of Onset  . Hypertension Mother   . Heart disease Mother 37    AMI   . Hyperlipidemia Mother   . Hypertension Sister   . Hyperlipidemia Sister   . Stroke Sister   . Colon cancer Neg Hx   . Esophageal cancer Neg Hx   . Rectal cancer Neg Hx   . Stomach cancer Neg Hx   . Liver cancer Neg Hx        Objective:    BP 129/87 (BP Location: Right Arm,  Patient Position: Sitting, Cuff Size: Normal)   Pulse (!) 102   Temp 97.8 F (36.6 C) (Oral)   Resp 16   Ht 5' (1.524 m)   Wt 134 lb 3.2 oz (60.9 kg)   LMP 01/25/2016   SpO2 96%   BMI 26.21 kg/m  Physical Exam  Constitutional: She is oriented to person, place, and time. She appears well-developed and well-nourished. No distress.  HENT:  Head: Normocephalic and atraumatic.  Right Ear: External ear normal.  Left Ear: External ear normal.  Nose: Nose normal.  Mouth/Throat: Oropharynx is clear and moist.  Eyes: Conjunctivae and EOM are normal. Pupils are equal, round, and reactive to light.  Neck: Normal range of motion. Neck supple. Carotid bruit is not present. No thyromegaly present.  Cardiovascular: Normal rate, regular rhythm, normal heart  sounds and intact distal pulses.  Exam reveals no gallop and no friction rub.   No murmur heard. Pulmonary/Chest: Effort normal and breath sounds normal. She has no wheezes. She has no rales.  Abdominal: Soft. Bowel sounds are normal. She exhibits no distension and no mass. There is no tenderness. There is no rebound and no guarding.  Lymphadenopathy:    She has no cervical adenopathy.  Neurological: She is alert and oriented to person, place, and time. No cranial nerve deficit.  Skin: Skin is warm and dry. No rash noted. She is not diaphoretic. No erythema. No pallor.  Psychiatric: She has a normal mood and affect. Her behavior is normal.   Results for orders placed or performed in visit on 02/18/16  POCT urinalysis dipstick  Result Value Ref Range   Color, UA yellow yellow   Clarity, UA clear clear   Glucose, UA negative negative   Bilirubin, UA negative negative   Ketones, POC UA trace (5) (A) negative   Spec Grav, UA 1.025    Blood, UA trace-intact (A) negative   pH, UA 6.0    Protein Ur, POC negative negative   Urobilinogen, UA 0.2    Nitrite, UA Negative Negative   Leukocytes, UA Negative Negative   Depression screen [facility] 2/9 02/18/2016 11/27/2015 10/29/2015 01/17/2014  Decreased Interest 0 0 0 0  Down, Depressed, Hopeless 0 0 0 0  PHQ - 2 Score 0 0 0 0       Assessment & Plan:   1. Pure hypercholesterolemia   2. Essential hypertension   3. Screening for HIV (human immunodeficiency virus)   4. Functional diarrhea   5. Breast cancer screening    New onset hypercholesterolemia; rx for Atorvastatin provided. -New onset hypertension; rx for HCTZ 12.5mg  daily provided. -refer for mammogram. -diarrhea improved; scheduled for colonoscopy; obtain celiac panel   Orders Placed This Encounter  Procedures  . MM SCREENING BREAST TOMO BILATERAL    Standing Status:   Future    Standing Expiration Date:   04/20/2017    Order Specific Question:   Reason for Exam (SYMPTOM  OR  DIAGNOSIS REQUIRED)    Answer:   annual screening    Order Specific Question:   Is the patient pregnant?    Answer:   No    Order Specific Question:   Preferred imaging location?    Answer:   [facility]  . CBC with Differential/Platelet  . Comprehensive metabolic panel    Order Specific Question:   Has the patient fasted?    Answer:   Yes  . Lipid panel    Order Specific Question:   Has the patient fasted?  Answer:   Yes  . HIV antibody  . Celiac Disease Panel  . POCT urinalysis dipstick  . EKG 12-Lead   Meds ordered this encounter  Medications  . hydrochlorothiazide (HYDRODIURIL) 12.5 MG tablet    Sig: Take 1 tablet (12.5 mg total) by mouth daily.    Dispense:  90 tablet    Refill:  1  . atorvastatin (LIPITOR) 10 MG tablet    Sig: Take 1 tablet (10 mg total) by mouth daily.    Dispense:  90 tablet    Refill:  1    Return in about 3 months (around 05/18/2016) for recheck blood pressure and high cholesterol.   Ariyonna Twichell Elayne Guerin, M.D. [facility] [address] [place], [place]  [address] [phone] phone [phone] fax

## 2016-02-22 LAB — COMPREHENSIVE METABOLIC PANEL
ALBUMIN: 4.7 g/dL (ref 3.5–5.5)
ALT: 12 IU/L (ref 0–32)
AST: 15 IU/L (ref 0–40)
Albumin/Globulin Ratio: 1.6 (ref 1.2–2.2)
Alkaline Phosphatase: 69 IU/L (ref 39–117)
BILIRUBIN TOTAL: 0.7 mg/dL (ref 0.0–1.2)
BUN / CREAT RATIO: 20 (ref 9–23)
BUN: 13 mg/dL (ref 6–24)
CALCIUM: 9.6 mg/dL (ref 8.7–10.2)
CHLORIDE: 100 mmol/L (ref 96–106)
CO2: 24 mmol/L (ref 18–29)
Creatinine, Ser: 0.64 mg/dL (ref 0.57–1.00)
GFR, EST AFRICAN AMERICAN: 121 mL/min/{1.73_m2} (ref >59)
GFR, EST NON AFRICAN AMERICAN: 105 mL/min/{1.73_m2} (ref >59)
GLUCOSE: 77 mg/dL (ref 65–99)
Globulin, Total: 2.9 g/dL (ref 1.5–4.5)
Potassium: 4.4 mmol/L (ref 3.5–5.2)
Sodium: 144 mmol/L (ref 134–144)
TOTAL PROTEIN: 7.6 g/dL (ref 6.0–8.5)

## 2016-02-22 LAB — CELIAC DISEASE PANEL
Endomysial IgA: NEGATIVE
IGA/IMMUNOGLOBULIN A, SERUM: 294 mg/dL (ref 87–352)
Transglutaminase IgA: <2 U/mL (ref 0–3)

## 2016-02-22 LAB — LIPID PANEL
CHOL/HDL RATIO: 3.8 ratio (ref 0.0–4.4)
Cholesterol, Total: 221 mg/dL — ABNORMAL HIGH (ref 100–199)
HDL: 58 mg/dL (ref >39)
LDL CALC: 114 mg/dL — AB (ref 0–99)
Triglycerides: 243 mg/dL — ABNORMAL HIGH (ref 0–149)
VLDL CHOLESTEROL CAL: 49 mg/dL — AB (ref 5–40)

## 2016-02-22 LAB — CBC WITH DIFFERENTIAL/PLATELET
BASOS ABS: 0 10*3/uL (ref 0.0–0.2)
BASOS: 0 %
EOS (ABSOLUTE): 0.1 10*3/uL (ref 0.0–0.4)
Eos: 1 %
HEMOGLOBIN: 15.1 g/dL (ref 11.1–15.9)
Hematocrit: 44.9 % (ref 34.0–46.6)
IMMATURE GRANS (ABS): 0 10*3/uL (ref 0.0–0.1)
Immature Granulocytes: 0 %
LYMPHS: 31 %
Lymphocytes Absolute: 2.4 10*3/uL (ref 0.7–3.1)
MCH: 31.3 pg (ref 26.6–33.0)
MCHC: 33.6 g/dL (ref 31.5–35.7)
MCV: 93 fL (ref 79–97)
MONOCYTES: 4 %
Monocytes Absolute: 0.3 10*3/uL (ref 0.1–0.9)
NEUTROS ABS: 5 10*3/uL (ref 1.4–7.0)
Neutrophils: 64 %
Platelets: 264 10*3/uL (ref 150–379)
RBC: 4.82 x10E6/uL (ref 3.77–5.28)
RDW: 14 % (ref 12.3–15.4)
WBC: 7.8 10*3/uL (ref 3.4–10.8)

## 2016-02-22 LAB — HIV ANTIBODY (ROUTINE TESTING W REFLEX): HIV Screen 4th Generation wRfx: NONREACTIVE

## 2016-03-09 ENCOUNTER — Encounter: Payer: Self-pay | Admitting: Internal Medicine

## 2016-03-12 ENCOUNTER — Telehealth: Payer: Self-pay

## 2016-03-12 NOTE — Telephone Encounter (Signed)
PT HUSBAND CALLED AND STATED THAT PT WAS REFERRED TO HAVE A COLONOSCOPY WANTS DIAGNOSES CHANGED SO THAT INSURANCE AND COVER It.

## 2016-03-12 NOTE — Telephone Encounter (Signed)
02/18/16 last ov for diarrhea, not sure if referral for this or screen?

## 2016-03-19 NOTE — Telephone Encounter (Signed)
Call --- I referred patient to GI for diarrhea.  At office visit with Dr. Henrene Pastor on 02/07/16, the entire office visit focused on diarrhea.  Indication for colonoscopy is for chronic loose stools and colon cancer screening per Dr. Blanch Media note.  I really cannot change the diagnosis or indication for referral to GI.  Please clarify from husband that there is not anything else that patient needs.

## 2016-03-20 ENCOUNTER — Encounter: Payer: Self-pay | Admitting: Internal Medicine

## 2016-03-20 NOTE — Telephone Encounter (Signed)
Pt is currently scheduled for 2/19 with Dr Henrene Pastor for this referral.

## 2016-03-20 NOTE — Telephone Encounter (Signed)
Can you advise on this referral?

## 2016-03-21 NOTE — Telephone Encounter (Signed)
pts husband advised he will speak with dr Henrene Pastor

## 2016-03-23 ENCOUNTER — Ambulatory Visit (AMBULATORY_SURGERY_CENTER): Payer: BLUE CROSS/BLUE SHIELD | Admitting: Internal Medicine

## 2016-03-23 ENCOUNTER — Encounter: Payer: Self-pay | Admitting: Internal Medicine

## 2016-03-23 VITALS — BP 127/85 | HR 70 | Temp 97.5°F | Resp 19 | Ht 59.5 in | Wt 134.0 lb

## 2016-03-23 DIAGNOSIS — Z1211 Encounter for screening for malignant neoplasm of colon: Secondary | ICD-10-CM

## 2016-03-23 DIAGNOSIS — R197 Diarrhea, unspecified: Secondary | ICD-10-CM

## 2016-03-23 MED ORDER — SODIUM CHLORIDE 0.9 % IV SOLN: 500.0000 mL | INTRAVENOUS | Status: DC

## 2016-03-23 NOTE — Progress Notes (Signed)
Report given to PACU, vss 

## 2016-03-23 NOTE — Progress Notes (Signed)
Called to room to assist during endoscopic procedure.  Patient ID and intended procedure confirmed with present staff. Received instructions for my participation in the procedure from the performing physician.  

## 2016-03-23 NOTE — Patient Instructions (Signed)
Impression/Recommendations:  Hemorrhoid handout given to patient.  Repeat colonoscopy in 10 years for screening purposes.  Await pathology results.    YOU HAD AN ENDOSCOPIC PROCEDURE TODAY AT [facility]:   Refer to the procedure report that was given to you for any specific questions about what was found during the examination.  If the procedure report does not answer your questions, please call your gastroenterologist to clarify.  If you requested that your care partner not be given the details of your procedure findings, then the procedure report has been included in a sealed envelope for you to review at your convenience later.  YOU SHOULD EXPECT: Some feelings of bloating in the abdomen. Passage of more gas than usual.  Walking can help get rid of the air that was put into your GI tract during the procedure and reduce the bloating. If you had a lower endoscopy (such as a colonoscopy or flexible sigmoidoscopy) you may notice spotting of blood in your stool or on the toilet paper. If you underwent a bowel prep for your procedure, you may not have a normal bowel movement for a few days.  Please Note:  You might notice some irritation and congestion in your nose or some drainage.  This is from the oxygen used during your procedure.  There is no need for concern and it should clear up in a day or so.  SYMPTOMS TO REPORT IMMEDIATELY:   Following lower endoscopy (colonoscopy or flexible sigmoidoscopy):  Excessive amounts of blood in the stool  Significant tenderness or worsening of abdominal pains  Swelling of the abdomen that is new, acute  Fever of 100F or higher For urgent or emergent issues, a gastroenterologist can be reached at any hour by calling [phone].   DIET:  We do recommend a small meal at first, but then you may proceed to your regular diet.  Drink plenty of fluids but you should avoid alcoholic beverages for 24 hours.  ACTIVITY:  You should plan to  take it easy for the rest of today and you should NOT DRIVE or use heavy machinery until tomorrow (because of the sedation medicines used during the test).    FOLLOW UP: Our staff will call the number listed on your records the next business day following your procedure to check on you and address any questions or concerns that you may have regarding the information given to you following your procedure. If we do not reach you, we will leave a message.  However, if you are feeling well and you are not experiencing any problems, there is no need to return our call.  We will assume that you have returned to your regular daily activities without incident.  If any biopsies were taken you will be contacted by phone or by letter within the next 1-3 weeks.  Please call us at [phone] if you have not heard about the biopsies in 3 weeks.    SIGNATURES/CONFIDENTIALITY: You and/or your care partner have signed paperwork which will be entered into your electronic medical record.  These signatures attest to the fact that that the information above on your After Visit Summary has been reviewed and is understood.  Full responsibility of the confidentiality of this discharge information lies with you and/or your care-partner.

## 2016-03-23 NOTE — Op Note (Signed)
[facility] Patient Name: Ashlee Davis Procedure Date: 03/23/2016 3:12 PM MRN: [id number] Endoscopist: Docia Chuck. Henrene Pastor , MD Age: 51 Referring MD:  Date of Birth: 1966/01/06 Gender: Female Account #: [id number] Procedure:                Colonoscopy, with biopsies Indications:              Clinically significant diarrhea of unexplained                            origin Medicines:                Monitored Anesthesia Care Procedure:                Pre-Anesthesia Assessment:                           - Prior to the procedure, a History and Physical                            was performed, and patient medications and                            allergies were reviewed. The patient's tolerance of                            previous anesthesia was also reviewed. The risks                            and benefits of the procedure and the sedation                            options and risks were discussed with the patient.                            All questions were answered, and informed consent                            was obtained. Prior Anticoagulants: The patient has                            taken no previous anticoagulant or antiplatelet                            agents. ASA Grade Assessment: II - A patient with                            mild systemic disease. After reviewing the risks                            and benefits, the patient was deemed in                            satisfactory condition to undergo the procedure.  After obtaining informed consent, the colonoscope                            was passed under direct vision. Throughout the                            procedure, the patient's blood pressure, pulse, and                            oxygen saturations were monitored continuously. The                            Model CF-HQ190L [id number]) scope was introduced                            through the anus and advanced to the  the cecum,                            identified by appendiceal orifice and ileocecal                            valve. The terminal ileum, ileocecal valve,                            appendiceal orifice, and rectum were photographed.                            The quality of the bowel preparation was excellent.                            The colonoscopy was performed without difficulty.                            The patient tolerated the procedure well. The bowel                            preparation used was SUPREP. Scope In: 3:19:24 PM Scope Out: 3:28:44 PM Scope Withdrawal Time: 0 hours 8 minutes 2 seconds  Total Procedure Duration: 0 hours 9 minutes 20 seconds  Findings:                 The terminal ileum appeared normal.                           Internal hemorrhoids were found during retroflexion.                           The entire examined colon appeared normal on direct                            and retroflexion views. Biopsies for histology were                            taken with a cold forceps from the entire colon for  evaluation of microscopic colitis. Complications:            No immediate complications. Estimated blood loss:                            None. Estimated Blood Loss:     Estimated blood loss: none. Impression:               - The examined portion of the ileum was normal.                           - Internal hemorrhoids.                           - The entire examined colon is normal on direct and                            retroflexion views. Recommendation:           - Repeat colonoscopy in 10 years for screening                            purposes.                           - Patient has a contact number available for                            emergencies. The signs and symptoms of potential                            delayed complications were discussed with the                            patient. Return to normal  activities tomorrow.                            Written discharge instructions were provided to the                            patient.                           - Resume previous diet.                           - Continue present medications.                           - Await pathology results. We will send you a                            letter with the results and any further                            recommendations if necessary. Docia Chuck. Henrene Pastor, MD 03/23/2016 3:32:39 PM This report has been signed electronically.

## 2016-03-24 ENCOUNTER — Telehealth: Payer: Self-pay | Admitting: *Deleted

## 2016-03-24 NOTE — Telephone Encounter (Signed)
  Follow up Call-  Call back number 03/23/2016  Post procedure Call Back phone  # [phone]  Permission to leave phone message Yes  Some recent data might be hidden     Patient questions:  Do you have a fever, pain , or abdominal swelling? No. Pain Score  0 *  Have you tolerated food without any problems? Yes.    Have you been able to return to your normal activities? Yes.    Do you have any questions about your discharge instructions: Diet   No. Medications  No. Follow up visit  No.  Do you have questions or concerns about your Care? No.  Actions: * If pain score is 4 or above: No action needed, pain <4.

## 2016-03-26 ENCOUNTER — Encounter: Payer: Self-pay | Admitting: Internal Medicine

## 2016-04-08 ENCOUNTER — Ambulatory Visit (INDEPENDENT_AMBULATORY_CARE_PROVIDER_SITE_OTHER): Payer: BLUE CROSS/BLUE SHIELD | Admitting: Family Medicine

## 2016-04-08 ENCOUNTER — Encounter: Payer: Self-pay | Admitting: Family Medicine

## 2016-04-08 VITALS — BP 129/85 | HR 94 | Temp 98.1°F | Resp 16 | Ht 59.0 in | Wt 137.0 lb

## 2016-04-08 DIAGNOSIS — I10 Essential (primary) hypertension: Secondary | ICD-10-CM | POA: Diagnosis not present

## 2016-04-08 DIAGNOSIS — E7800 Pure hypercholesterolemia, unspecified: Secondary | ICD-10-CM | POA: Diagnosis not present

## 2016-04-08 DIAGNOSIS — Z823 Family history of stroke: Secondary | ICD-10-CM | POA: Diagnosis not present

## 2016-04-08 DIAGNOSIS — M7062 Trochanteric bursitis, left hip: Secondary | ICD-10-CM | POA: Diagnosis not present

## 2016-04-08 DIAGNOSIS — Z6827 Body mass index (BMI) 27.0-27.9, adult: Secondary | ICD-10-CM | POA: Diagnosis not present

## 2016-04-08 MED ORDER — DICLOFENAC SODIUM 1 % TD GEL: 2.0000 g | Freq: Four times a day (QID) | TRANSDERMAL | 1 refills | Status: DC

## 2016-04-08 NOTE — Patient Instructions (Addendum)
Do stretching as listed below and see if you have improvement in your symptoms. You can take Tylenol for pain 2x tablets every 4-6 hours as needed.     IF you received an x-ray today, you will receive an invoice from [facility]. Please contact [facility] at [phone] with questions or concerns regarding your invoice.   IF you received labwork today, you will receive an invoice from [facility]. Please contact [facility] at [phone] with questions or concerns regarding your invoice.   Our billing staff will not be able to assist you with questions regarding bills from these companies.  You will be contacted with the lab results as soon as they are available. The fastest way to get your results is to activate your My Chart account. Instructions are located on the last page of this paperwork. If you have not heard from [place] regarding the results in 2 weeks, please contact this office.      Gluteus Medius Syndrome Rehab Ask your health care provider which exercises are safe for you. Do exercises exactly as told by your health care provider and adjust them as directed. It is normal to feel mild stretching, pulling, tightness, or discomfort as you do these exercises, but you should stop right away if you feel sudden pain or your pain gets worse. Do not begin these exercises until told by your health care provider. Stretching and range of motion exercise This exercise warms up your muscles and joints and improves the movement and flexibility of your hip and pelvis. This exercise also helps to relieve pain and stiffness. Exercise A: Lunge (  hip flexor stretch) 1. Kneel on the floor on your left / right knee. Bend your other knee so it is directly over your ankle. 2. Keep good posture with your head over your shoulders. Tuck your tailbone underneath you. This will prevent your back from arching too much. 3. You should feel a gentle stretch in the front of your thigh or hip. If you  do not feel a stretch, slowly lunge forward with your chest up. 4. Hold this position for ___30_____ seconds. 5. Slowly return to the starting position. Repeat ____2______ times. Complete this exercise ___2_______ times a day. Strengthening exercises These exercises build strength and endurance in your hip and pelvis. Endurance is the ability to use your muscles for a long time, even after they get tired. Exercise B: Bridge ( hip extensors) 1. Lie on your back on a firm surface with your knees bent and your feet flat on the floor. 2. Tighten your buttocks muscles and lift your bottom off the floor until the trunk of your body is level with your thighs.  You should feel the muscles working in your buttocks and the back of your thighs. If this exercise is too easy, cross your arms over your chest or lift one leg while your bottom is up off the floor.  Do not arch your back. 3. Hold this position for __30_______ seconds. 4. Slowly lower your hips to the starting position. 5. Let your muscles relax completely between repetitions. Repeat __2______ times. Complete this exercise ___2_______ times a day. Exercise C: Straight leg raises ( hip abductors) 1. Lie on your side with your left / right leg in the top position. Lie so your head, shoulder, knee, and hip line up. Bend your bottom knee to help you balance. 2. Lift your top leg up 4-6 inches (10-15 cm), keeping your toes pointed straight ahead. 3. Hold this position  for ____15______ seconds. 4. Slowly lower your leg to the starting position and let your muscles relax completely. Repeat ____4_____ times. Complete this exercise ____2______ times a day. Exercise D: Hip abductors and external rotators, quadruped 1. Get on your hands and knees on a firm, lightly padded surface. Your hands should be directly below your shoulders, and your knees should be directly below your hips. 2. Lift your left / right knee out to the side. Keep your knee bent.  Do not twist your body. 3. Hold this position for _____20_____ seconds. 4. Slowly lower your leg. Repeat _____2_____ times. Complete this exercise ____2______ times a day. Exercise E: Single leg stand 1. Stand near a counter or door frame to hold onto as needed. It is helpful to look in a mirror for this exercise so you can watch your hip. 2. Squeeze your left / right buttock muscles then lift up your other foot. Do not let your left / righthip push out to the side. 3. Hold this position for ___20_______ seconds. Repeat ____2______ times. Complete this exercise ___2_______ times a day. This information is not intended to replace advice given to you by your health care provider. Make sure you discuss any questions you have with your health care provider. Document Released: 01/19/2005 Document Revised: 09/26/2015 Document Reviewed: 01/01/2015 Elsevier Interactive Patient Education  2017 [employer].

## 2016-04-08 NOTE — Progress Notes (Signed)
     Patient ID: Ashlee Davis, female    DOB: 28-Oct-1965, 51 y.o.   MRN: [id number]  PCP: Reginia Forts, MD  Chief Complaint  Patient presents with  . Follow-up    BP check/ med check    Subjective:   Presents for evaluation of BP and medications  She has been monitoring her blood pressure and the have been around the 120s at home. She has continue to exercise and eat well but hs had a pain in her left side just above her hip joint. She can't remember a specific time of onset but its kind of always been there (last 4 months) . She also has a stiffness in legs and does take tylenol for pain on occasions it gets bad. Her hip pain hasn't awoken hr from sleep or stopped her from going to sleep just a intermittent dull ache. Around a 3-4 out of 10 for pain. Denies trauma.  Review of Systems  Musculoskeletal: Positive for arthralgias and myalgias. Negative for back pain and gait problem.  All other systems reviewed and are negative.      Patient Active Problem List   Diagnosis Date Noted  . Trochanteric bursitis of left hip 04/08/2016  . Essential hypertension 04/08/2016  . Pure hypercholesterolemia 04/08/2016  . Family history of stroke 04/08/2016     Prior to Admission medications   Medication Sig Start Date End Date Taking? Authorizing Provider  aspirin 81 MG tablet Take 81 mg by mouth as needed. PT STATES SHE TAKES TWO MAYBE EVERY OTHER DAY   Yes Historical Provider, MD  atorvastatin (LIPITOR) 10 MG tablet Take 1 tablet (10 mg total) by mouth daily. 02/18/16  Yes Wardell Honour, MD  hydrochlorothiazide (HYDRODIURIL) 12.5 MG tablet Take 1 tablet (12.5 mg total) by mouth daily. 02/18/16  Yes Wardell Honour, MD     Allergies  Allergen Reactions  . Wine [Alcohol]        Objective:  Physical Exam  Constitutional: She is oriented to person, place, and time. She appears well-developed and well-nourished. She is active.  Blood pressure 129/85, pulse 94, temperature 98.1 F  (36.7 C), temperature source Oral, resp. rate 16, height 4\' 11"  (1.499 m), weight 137 lb (62.1 kg), last menstrual period 03/22/2016, SpO2 100 %.  Eyes: Pupils are equal, round, and reactive to light.  Neck: Trachea normal and normal range of motion. Neck supple.  Cardiovascular: Normal rate, regular rhythm and normal heart sounds.   Pulmonary/Chest: Effort normal and breath sounds normal.  Musculoskeletal:       Legs: Neurological: She is alert and oriented to person, place, and time.  Skin: Skin is warm and dry.  Psychiatric: She has a normal mood and affect. Her behavior is normal. Judgment and thought content normal.  Vitals reviewed.   ASCVD Risk 1.7%, MD Calc soruce    Assessment & Plan:  1. Trochanteric bursitis of left hip Voltaren Gel 4 times daily for pain as needed  2. Essential hypertension Continue lifestyle modification and medications   3. Pure hypercholesterolemia Continue lifestyle modification and medications  - Lipid panel - Comprehensive metabolic panel - CBC with Differential/Platelet  4. Family history of stroke

## 2016-04-08 NOTE — Progress Notes (Signed)
Subjective:    Patient ID: Ashlee Davis, female    DOB: 18-Mar-1965, 51 y.o.   MRN: [id number]  04/08/2016  Follow-up (BP check/ med check)   HPI This 51 y.o. female presents for evaluation of hypertension and hypercholesterolemia. Management changes made at last visit include adding Atorvastatin and HCTZ.  Patient reports good compliance with medication, good tolerance to medication, and good symptom control.   Desired aggressive control of lipids and blood pressure due to strong family history of CVA in mother.  Exercising regularly.   REFUSES FLU VACCINE.  L hip pain: has been suffering with intermittent L hip pain; no associated lower back pain; no pain radiating into leg.  No n/t/w.  No  Saddle paresthesias.  No b/b dysfunction.  Immunization History  Administered Date(s) Administered  . Tdap 07/01/2012   BP Readings from Last 3 Encounters:  04/08/16 129/85  03/23/16 127/85  02/18/16 129/87   Wt Readings from Last 3 Encounters:  04/08/16 137 lb (62.1 kg)  03/23/16 134 lb (60.8 kg)  02/18/16 134 lb 3.2 oz (60.9 kg)     Review of Systems  Constitutional: Negative for chills, diaphoresis, fatigue and fever.  Eyes: Negative for visual disturbance.  Respiratory: Negative for cough and shortness of breath.   Cardiovascular: Negative for chest pain, palpitations and leg swelling.  Gastrointestinal: Negative for abdominal pain, constipation, diarrhea, nausea and vomiting.  Endocrine: Negative for cold intolerance, heat intolerance, polydipsia, polyphagia and polyuria.  Genitourinary: Negative for difficulty urinating, dysuria, menstrual problem and pelvic pain.  Musculoskeletal: Positive for arthralgias. Negative for back pain.  Neurological: Negative for dizziness, tremors, seizures, syncope, facial asymmetry, speech difficulty, weakness, light-headedness, numbness and headaches.    Past Medical History:  Diagnosis Date  . Allergy   . Arthritis   . Hypertension   .  Nevus   . Other and unspecified hyperlipidemia    Past Surgical History:  Procedure Laterality Date  . NONE     Allergies  Allergen Reactions  . Wine [Alcohol]     Social History   Social History  . Marital status: Married    Spouse name: Ferninand  . Number of children: 0  . Years of education: 12   Occupational History  . homemaker    Social History Main Topics  . Smoking status: Never Smoker  . Smokeless tobacco: Never Used  . Alcohol use Yes     Comment: wine occasionally  . Drug use: No  . Sexual activity: Yes    Partners: Male   Other Topics Concern  . Not on file   Social History Narrative   Marital status: married since 1999.  Happily married; no abuse.      Children:  None      Lives: with husband; from [place]; moved to [place] to [place].   From the [place], left at age 19 for the [place].      Tobacco: never      Alcohol: socially but rare due to skin flushing.      Exercises:  Walking every morning; works in garden a lot.   Always uses seat belts. Smoke alarm in the home. No guns in the home.   Caffeine use: in moderation daily.   Family History  Problem Relation Age of Onset  . Hypertension Mother   . Heart disease Mother 41    AMI   . Hyperlipidemia Mother   . Hypertension Sister   . Hyperlipidemia Sister   . Stroke Sister   .  Colon cancer Neg Hx   . Esophageal cancer Neg Hx   . Rectal cancer Neg Hx   . Stomach cancer Neg Hx   . Liver cancer Neg Hx        Objective:    BP 129/85   Pulse 94   Temp 98.1 F (36.7 C) (Oral)   Resp 16   Ht 4\' 11"  (1.499 m)   Wt 137 lb (62.1 kg)   LMP 03/22/2016   SpO2 100%   BMI 27.67 kg/m  Physical Exam  Constitutional: She is oriented to person, place, and time. She appears well-developed and well-nourished. No distress.  HENT:  Head: Normocephalic and atraumatic.  Right Ear: External ear normal.  Left Ear: External ear normal.  Nose: Nose normal.  Mouth/Throat: Oropharynx is clear and  moist.  Eyes: Conjunctivae and EOM are normal. Pupils are equal, round, and reactive to light.  Neck: Normal range of motion. Neck supple. Carotid bruit is not present. No thyromegaly present.  Cardiovascular: Normal rate, regular rhythm, normal heart sounds and intact distal pulses.  Exam reveals no gallop and no friction rub.   No murmur heard. Pulmonary/Chest: Effort normal and breath sounds normal. She has no wheezes. She has no rales.  Abdominal: Soft. Bowel sounds are normal. She exhibits no distension and no mass. There is no tenderness. There is no rebound and no guarding.  Musculoskeletal:       Left hip: She exhibits bony tenderness. She exhibits normal range of motion, normal strength and no tenderness.       Lumbar back: Normal. She exhibits normal range of motion, no tenderness, no bony tenderness, no pain and no spasm.  L HIP: +TTP lateral aspect of hip at bursa.  Lymphadenopathy:    She has no cervical adenopathy.  Neurological: She is alert and oriented to person, place, and time. No cranial nerve deficit.  Skin: Skin is warm and dry. No rash noted. She is not diaphoretic. No erythema. No pallor.  Psychiatric: She has a normal mood and affect. Her behavior is normal.   Depression screen [facility] 2/9 04/08/2016 02/18/2016 11/27/2015 10/29/2015 01/17/2014  Decreased Interest 0 0 0 0 0  Down, Depressed, Hopeless 0 0 0 0 0  PHQ - 2 Score 0 0 0 0 0   Fall Risk  04/08/2016 02/18/2016 11/27/2015 10/29/2015 01/17/2014  Falls in the past year? No No No No No        Assessment & Plan:   1. Essential hypertension   2. Trochanteric bursitis of left hip   3. Pure hypercholesterolemia   4. Family history of stroke   5. BMI 27.0-27.9,adult    -improved blood pressure with HCTZ; continue current medications.   -tolerating statin therapy; repeat lipid. -new onset L trochanteric bursitis; recommend home exercise program, icing, Voltaren gel. -warrants aggressive treatment of blood pressure  and lipids due to family hx of CVA.   Orders Placed This Encounter  Procedures  . Lipid panel    Order Specific Question:   Has the patient fasted?    Answer:   Yes  . Comprehensive metabolic panel    Order Specific Question:   Has the patient fasted?    Answer:   Yes  . CBC with Differential/Platelet   Meds ordered this encounter  Medications  . diclofenac sodium (VOLTAREN) 1 % GEL    Sig: Apply 2 g topically 4 (four) times daily.    Dispense:  1 Tube    Refill:  1  Return in about 6 months (around 10/09/2016) for recheck blood presure, cholesterol.   Nannette Zill Elayne Guerin, M.D. Primary Care at [facility] previously [facility] [address] [place], [place]  [address] [phone] phone [phone] fax

## 2016-04-09 ENCOUNTER — Telehealth: Payer: Self-pay

## 2016-04-09 LAB — CBC WITH DIFFERENTIAL/PLATELET
BASOS: 0 %
Basophils Absolute: 0 10*3/uL (ref 0.0–0.2)
EOS (ABSOLUTE): 0.1 10*3/uL (ref 0.0–0.4)
Eos: 2 %
HEMOGLOBIN: 14.7 g/dL (ref 11.1–15.9)
Hematocrit: 40.8 % (ref 34.0–46.6)
IMMATURE GRANS (ABS): 0 10*3/uL (ref 0.0–0.1)
Immature Granulocytes: 0 %
LYMPHS ABS: 2.7 10*3/uL (ref 0.7–3.1)
LYMPHS: 34 %
MCH: 32.7 pg (ref 26.6–33.0)
MCHC: 36 g/dL — AB (ref 31.5–35.7)
MCV: 91 fL (ref 79–97)
MONOCYTES: 6 %
Monocytes Absolute: 0.5 10*3/uL (ref 0.1–0.9)
NEUTROS ABS: 4.7 10*3/uL (ref 1.4–7.0)
Neutrophils: 58 %
Platelets: 250 10*3/uL (ref 150–379)
RBC: 4.49 x10E6/uL (ref 3.77–5.28)
RDW: 13.9 % (ref 12.3–15.4)
WBC: 8 10*3/uL (ref 3.4–10.8)

## 2016-04-09 LAB — COMPREHENSIVE METABOLIC PANEL
A/G RATIO: 1.7 (ref 1.2–2.2)
ALBUMIN: 4.6 g/dL (ref 3.5–5.5)
ALT: 46 IU/L — AB (ref 0–32)
AST: 34 IU/L (ref 0–40)
Alkaline Phosphatase: 62 IU/L (ref 39–117)
BILIRUBIN TOTAL: 1 mg/dL (ref 0.0–1.2)
BUN/Creatinine Ratio: 22 (ref 9–23)
BUN: 13 mg/dL (ref 6–24)
CALCIUM: 9.6 mg/dL (ref 8.7–10.2)
CHLORIDE: 98 mmol/L (ref 96–106)
CO2: 22 mmol/L (ref 18–29)
Creatinine, Ser: 0.6 mg/dL (ref 0.57–1.00)
GFR, EST AFRICAN AMERICAN: 124 mL/min/{1.73_m2} (ref >59)
GFR, EST NON AFRICAN AMERICAN: 107 mL/min/{1.73_m2} (ref >59)
Globulin, Total: 2.7 g/dL (ref 1.5–4.5)
Glucose: 88 mg/dL (ref 65–99)
POTASSIUM: 4 mmol/L (ref 3.5–5.2)
Sodium: 141 mmol/L (ref 134–144)
TOTAL PROTEIN: 7.3 g/dL (ref 6.0–8.5)

## 2016-04-09 LAB — LIPID PANEL
CHOL/HDL RATIO: 3.1 ratio (ref 0.0–4.4)
Cholesterol, Total: 178 mg/dL (ref 100–199)
HDL: 58 mg/dL (ref >39)
LDL Calculated: 86 mg/dL (ref 0–99)
TRIGLYCERIDES: 169 mg/dL — AB (ref 0–149)
VLDL Cholesterol Cal: 34 mg/dL (ref 5–40)

## 2016-04-09 NOTE — Telephone Encounter (Signed)
Pa needed for diclofenac  Key   [id number] covermymeds

## 2016-04-10 NOTE — Telephone Encounter (Signed)
Form faxed today for quantity limit exception to be signed by provider in dr Darliss Ridgel box

## 2016-04-14 NOTE — Telephone Encounter (Signed)
What is quantity limit?

## 2016-04-15 NOTE — Telephone Encounter (Signed)
Pending

## 2016-04-27 MED ORDER — MELOXICAM 7.5 MG PO TABS: 7.5000 mg | ORAL_TABLET | Freq: Every day | ORAL | 0 refills | Status: DC

## 2016-04-27 NOTE — Telephone Encounter (Signed)
Spoke to patient she said she can not get the medication because it costs to much.  She took tylenol for the hip pain and she feels better now.

## 2016-04-27 NOTE — Telephone Encounter (Signed)
Call patient ---- insurance will not cover Diclofenac gel for hip pain. I have sent in Meloxicam 7.5mg  one tablet daily as needed for hip pain.

## 2016-05-07 ENCOUNTER — Encounter: Payer: Self-pay | Admitting: Family Medicine

## 2016-05-07 ENCOUNTER — Ambulatory Visit (INDEPENDENT_AMBULATORY_CARE_PROVIDER_SITE_OTHER): Payer: BLUE CROSS/BLUE SHIELD | Admitting: Family Medicine

## 2016-05-07 VITALS — BP 130/82 | HR 70 | Temp 97.6°F | Resp 18 | Ht 59.0 in | Wt 140.6 lb

## 2016-05-07 DIAGNOSIS — E7800 Pure hypercholesterolemia, unspecified: Secondary | ICD-10-CM

## 2016-05-07 DIAGNOSIS — L245 Irritant contact dermatitis due to other chemical products: Secondary | ICD-10-CM | POA: Diagnosis not present

## 2016-05-07 DIAGNOSIS — Z823 Family history of stroke: Secondary | ICD-10-CM | POA: Diagnosis not present

## 2016-05-07 DIAGNOSIS — I10 Essential (primary) hypertension: Secondary | ICD-10-CM | POA: Diagnosis not present

## 2016-05-07 MED ORDER — CETIRIZINE HCL 10 MG PO TABS: 10.0000 mg | ORAL_TABLET | Freq: Every day | ORAL | 0 refills | Status: DC

## 2016-05-07 MED ORDER — TRIAMCINOLONE ACETONIDE 0.1 % EX CREA: 1.0000 "application " | TOPICAL_CREAM | Freq: Two times a day (BID) | CUTANEOUS | 0 refills | Status: DC

## 2016-05-07 NOTE — Patient Instructions (Addendum)
     IF you received an x-ray today, you will receive an invoice from [facility]. Please contact [facility] at [phone] with questions or concerns regarding your invoice.   IF you received labwork today, you will receive an invoice from [place]. Please contact [facility] at [phone] with questions or concerns regarding your invoice.   Our billing staff will not be able to assist you with questions regarding bills from these companies.  You will be contacted with the lab results as soon as they are available. The fastest way to get your results is to activate your My Chart account. Instructions are located on the last page of this paperwork. If you have not heard from [place] regarding the results in 2 weeks, please contact this office.      Contact Dermatitis Dermatitis is redness, soreness, and swelling (inflammation) of the skin. Contact dermatitis is a reaction to certain substances that touch the skin. You either touched something that irritated your skin, or you have allergies to something you touched. Follow these instructions at home: [place] your skin as needed.  Apply cool compresses to the affected areas.  Try taking a bath with:  Epsom salts. Follow the instructions on the package. You can get these at a pharmacy or grocery store.  Baking soda. Pour a small amount into the bath as told by your doctor.  Colloidal oatmeal. Follow the instructions on the package. You can get this at a pharmacy or grocery store.  Try applying baking soda paste to your skin. Stir water into baking soda until it looks like paste.  Do not scratch your skin.  Bathe less often.  Bathe in lukewarm water. Avoid using hot water. Medicines   Take or apply over-the-counter and prescription medicines only as told by your doctor.  If you were prescribed an antibiotic medicine, take or apply your antibiotic as told by your doctor. Do not stop taking the  antibiotic even if your condition starts to get better. General instructions   Keep all follow-up visits as told by your doctor. This is important.  Avoid the substance that caused your reaction. If you do not know what caused it, keep a journal to try to track what caused it. Write down:  What you eat.  What cosmetic products you use.  What you drink.  What you wear in the affected area. This includes jewelry.  If you were given a bandage (dressing), take care of it as told by your doctor. This includes when to change and remove it. Contact a doctor if:  You do not get better with treatment.  Your condition gets worse.  You have signs of infection such as:  Swelling.  Tenderness.  Redness.  Soreness.  Warmth.  You have a fever.  You have new symptoms. Get help right away if:  You have a very bad headache.  You have neck pain.  Your neck is stiff.  You throw up (vomit).  You feel very sleepy.  You see red streaks coming from the affected area.  Your bone or joint underneath the affected area becomes painful after the skin has healed.  The affected area turns darker.  You have trouble breathing. This information is not intended to replace advice given to you by your health care provider. Make sure you discuss any questions you have with your health care provider. Document Released: 11/16/2008 Document Revised: 06/27/2015 Document Reviewed: 06/06/2014 Elsevier Interactive Patient Education  2017 [employer].

## 2016-05-07 NOTE — Progress Notes (Signed)
Subjective:    Patient ID: Ashlee Davis, female    DOB: 1966-01-16, 51 y.o.   MRN: [id number]  05/07/2016  Rash (on arms and legs x 6 days )   HPI This 51 y.o. female presents for evaluation of rash on extremities for six days.    Leaving for [place] this week. Did lay hay two weeks ago. Onset of rash in past 7-10 days.  +itching a lot. Did eat a lot of crab cakes recently.  This is very unusual for patient.  Denies facial swelling, tongue swelling, throat swelling.  Denies involvement of mucosal services.  HTN: Patient reports good compliance with medication, good tolerance to medication, and good symptom control.  Home BPs running 120s/70s.  Exercising daily on treadmill and working in yard.  Hyperlipidemia: Patient reports good compliance with medication, good tolerance to medication, and good symptom control.    Immunization History  Administered Date(s) Administered  . Tdap 07/01/2012   BP Readings from Last 3 Encounters:  05/07/16 130/82  04/08/16 129/85  03/23/16 127/85   Wt Readings from Last 3 Encounters:  05/07/16 140 lb 9.6 oz (63.8 kg)  04/08/16 137 lb (62.1 kg)  03/23/16 134 lb (60.8 kg)    Review of Systems  Constitutional: Negative for chills, diaphoresis, fatigue and fever.  HENT: Negative for ear pain, facial swelling, postnasal drip, rhinorrhea, sinus pressure, sore throat, trouble swallowing and voice change.   Respiratory: Negative for cough and shortness of breath.   Cardiovascular: Negative for chest pain, palpitations and leg swelling.  Gastrointestinal: Negative for abdominal pain, constipation, diarrhea, nausea and vomiting.  Genitourinary: Negative for vaginal pain.  Skin: Positive for rash. Negative for color change and pallor.    Past Medical History:  Diagnosis Date  . Allergy   . Arthritis   . Hypertension   . Nevus   . Other and unspecified hyperlipidemia    Past Surgical History:  Procedure Laterality Date  . NONE      Allergies  Allergen Reactions  . Wine [Alcohol]     Social History   Social History  . Marital status: Married    Spouse name: Ferninand  . Number of children: 0  . Years of education: 12   Occupational History  . homemaker    Social History Main Topics  . Smoking status: Never Smoker  . Smokeless tobacco: Never Used  . Alcohol use Yes     Comment: wine occasionally  . Drug use: No  . Sexual activity: Yes    Partners: Male   Other Topics Concern  . Not on file   Social History Narrative   Marital status: married since 1999.  Happily married; no abuse.      Children:  None      Lives: with husband; from [place]; moved to [place] to [place].   From the [place], left at age 64 for the [place].      Tobacco: never      Alcohol: socially but rare due to skin flushing.      Exercises:  Walking every morning; works in garden a lot.   Always uses seat belts. Smoke alarm in the home. No guns in the home.   Caffeine use: in moderation daily.   Family History  Problem Relation Age of Onset  . Hypertension Mother   . Heart disease Mother 29    AMI   . Hyperlipidemia Mother   . Hypertension Sister   . Hyperlipidemia Sister   . Stroke Sister   .  Colon cancer Neg Hx   . Esophageal cancer Neg Hx   . Rectal cancer Neg Hx   . Stomach cancer Neg Hx   . Liver cancer Neg Hx        Objective:    BP 130/82   Pulse 70   Temp 97.6 F (36.4 C) (Oral)   Resp 18   Ht 4\' 11"  (1.499 m)   Wt 140 lb 9.6 oz (63.8 kg)   SpO2 100%   BMI 28.40 kg/m  Physical Exam  Constitutional: She is oriented to person, place, and time. She appears well-developed and well-nourished. No distress.  HENT:  Head: Normocephalic and atraumatic.  Eyes: Conjunctivae are normal. Pupils are equal, round, and reactive to light.  Neck: Normal range of motion. Neck supple.  Cardiovascular: Normal rate, regular rhythm and normal heart sounds.  Exam reveals no gallop and no friction rub.   No murmur  heard. Pulmonary/Chest: Effort normal and breath sounds normal. She has no wheezes. She has no rales.  Neurological: She is alert and oriented to person, place, and time.  Skin: Rash noted. She is not diaphoretic.  Maculopapular rash along B forearms and anterior chest.  Scant facial involvement.  Psychiatric: She has a normal mood and affect. Her behavior is normal.  Nursing note and vitals reviewed.       Assessment & Plan:   1. Irritant contact dermatitis due to other chemical products   2. Essential hypertension   3. Pure hypercholesterolemia   4. Family history of stroke    New onset contact dermatitis; distribution consistent with environmental exposure; rx for Triamcinolone cream and Zyrtec.  Doubt current rash due to seafood/shellfish ingestion. -tolerating HCTZ and Atorvastatin therapies.  Continue current medications.   No orders of the defined types were placed in this encounter.  Meds ordered this encounter  Medications  . triamcinolone cream (KENALOG) 0.1 %    Sig: Apply 1 application topically 2 (two) times daily.    Dispense:  30 g    Refill:  0  . cetirizine (ZYRTEC) 10 MG tablet    Sig: Take 1 tablet (10 mg total) by mouth daily.    Dispense:  30 tablet    Refill:  0    No Follow-up on file.   Katlin Ciszewski Elayne Guerin, M.D. Primary Care at [facility] previously Urgent [place] [address] [place], [place]  [address] [phone] phone [phone] fax

## 2016-05-18 DIAGNOSIS — B86 Scabies: Secondary | ICD-10-CM | POA: Diagnosis not present

## 2016-05-19 ENCOUNTER — Ambulatory Visit: Payer: BLUE CROSS/BLUE SHIELD | Admitting: Family Medicine

## 2016-06-10 ENCOUNTER — Ambulatory Visit (INDEPENDENT_AMBULATORY_CARE_PROVIDER_SITE_OTHER): Payer: BLUE CROSS/BLUE SHIELD | Admitting: Family Medicine

## 2016-06-10 ENCOUNTER — Encounter: Payer: Self-pay | Admitting: Family Medicine

## 2016-06-10 VITALS — BP 135/90 | HR 88 | Temp 97.2°F | Resp 16 | Ht 59.0 in | Wt 140.2 lb

## 2016-06-10 DIAGNOSIS — R21 Rash and other nonspecific skin eruption: Secondary | ICD-10-CM | POA: Diagnosis not present

## 2016-06-10 MED ORDER — KETOCONAZOLE 2 % EX CREA: 1.0000 "application " | TOPICAL_CREAM | Freq: Two times a day (BID) | CUTANEOUS | 0 refills | Status: DC | PRN

## 2016-06-10 MED ORDER — TERBINAFINE HCL 250 MG PO TABS: 250.0000 mg | ORAL_TABLET | Freq: Every day | ORAL | 0 refills | Status: DC

## 2016-06-10 NOTE — Patient Instructions (Addendum)
  STOP ATORVASTATIN.     IF you received an x-ray today, you will receive an invoice from [facility]. Please contact [facility] at [phone] with questions or concerns regarding your invoice.   IF you received labwork today, you will receive an invoice from [place]. Please contact [facility] at [phone] with questions or concerns regarding your invoice.   Our billing staff will not be able to assist you with questions regarding bills from these companies.  You will be contacted with the lab results as soon as they are available. The fastest way to get your results is to activate your My Chart account. Instructions are located on the last page of this paperwork. If you have not heard from [place] regarding the results in 2 weeks, please contact this office.

## 2016-06-10 NOTE — Progress Notes (Signed)
Subjective:    Patient ID: Ashlee Davis, female    DOB: 01-02-1966, 51 y.o.   MRN: [id number]  06/10/2016  Follow-up and Rash (ongoing rash on right arm and legs(knee down))   HPI This 51 y.o. female presents for evaluation of persistent rash.  Went to [place] and cleaned up; lots of bugs n boxes.  After three days in [place], diagnosed with scabies.  Prescribed permethrine.  Has a new bed.  Concerned that patient has scabies for real; has never had scabies.  Provider stated that pt should go back to PCP.  Has sepearte beds. Rash worsened after visit.  Horrible itching.  Prescribed triamcinolone without improvement.  Has used permethrine twice since dx.  Cracked nose and lips.     Colonoscopy 03/2016.       BP Readings from Last 3 Encounters:  06/24/16 135/90  06/10/16 135/90  05/07/16 130/82   Wt Readings from Last 3 Encounters:  06/24/16 140 lb 3.2 oz (63.6 kg)  06/10/16 140 lb 3.2 oz (63.6 kg)  05/07/16 140 lb 9.6 oz (63.8 kg)     Review of Systems  Constitutional: Negative for chills, diaphoresis, fatigue and fever.  Eyes: Negative for visual disturbance.  Respiratory: Negative for cough and shortness of breath.   Cardiovascular: Negative for chest pain, palpitations and leg swelling.  Gastrointestinal: Negative for abdominal pain, constipation, diarrhea, nausea and vomiting.  Endocrine: Negative for cold intolerance, heat intolerance, polydipsia, polyphagia and polyuria.  Skin: Positive for color change and rash.  Neurological: Negative for dizziness, tremors, seizures, syncope, facial asymmetry, speech difficulty, weakness, light-headedness, numbness and headaches.    Past Medical History:  Diagnosis Date  . Allergy   . Arthritis   . Hypertension   . Nevus   . Other and unspecified hyperlipidemia    Past Surgical History:  Procedure Laterality Date  . NONE     Allergies  Allergen Reactions  . Wine [Alcohol]     Social History   Social History    . Marital status: Married    Spouse name: Ferninand  . Number of children: 0  . Years of education: 12   Occupational History  . homemaker    Social History Main Topics  . Smoking status: Never Smoker  . Smokeless tobacco: Never Used  . Alcohol use Yes     Comment: wine occasionally  . Drug use: No  . Sexual activity: Yes    Partners: Male   Other Topics Concern  . Not on file   Social History Narrative   Marital status: married since 1999.  Happily married; no abuse.      Children:  None      Lives: with husband; from [place]; moved to [place] to [place].   From the [place], left at age 2 for the [place].      Tobacco: never      Alcohol: socially but rare due to skin flushing.      Exercises:  Walking every morning; works in garden a lot.   Always uses seat belts. Smoke alarm in the home. No guns in the home.   Caffeine use: in moderation daily.   Family History  Problem Relation Age of Onset  . Hypertension Mother   . Heart disease Mother 42       AMI   . Hyperlipidemia Mother   . Hypertension Sister   . Hyperlipidemia Sister   . Stroke Sister   . Colon cancer Neg Hx   . Esophageal cancer  Neg Hx   . Rectal cancer Neg Hx   . Stomach cancer Neg Hx   . Liver cancer Neg Hx        Objective:    BP 135/90   Pulse 88   Temp 97.2 F (36.2 C) (Oral)   Resp 16   Ht 4\' 11"  (1.499 m)   Wt 140 lb 3.2 oz (63.6 kg)   SpO2 97%   BMI 28.32 kg/m  Physical Exam  Constitutional: She is oriented to person, place, and time. She appears well-developed and well-nourished. No distress.  HENT:  Head: Normocephalic and atraumatic.  Right Ear: External ear normal.  Left Ear: External ear normal.  Nose: Nose normal.  Mouth/Throat: Oropharynx is clear and moist.  Eyes: Conjunctivae and EOM are normal. Pupils are equal, round, and reactive to light.  Neck: Normal range of motion. Neck supple. Carotid bruit is not present. No thyromegaly present.  Cardiovascular: Normal  rate, regular rhythm, normal heart sounds and intact distal pulses.  Exam reveals no gallop and no friction rub.   No murmur heard. Pulmonary/Chest: Effort normal and breath sounds normal. She has no wheezes. She has no rales.  Lymphadenopathy:    She has no cervical adenopathy.  Neurological: She is alert and oriented to person, place, and time. No cranial nerve deficit.  Skin: Skin is warm and dry. Rash noted. She is not diaphoretic. No erythema. No pallor.  Healing rash B forearms.  Psychiatric: She has a normal mood and affect. Her behavior is normal.        Assessment & Plan:   1. Skin rash    -persistent but improving; treat with ketoconazole cream and terbinafine. -stop Atorvastatin due to concern for potential side effect.   No orders of the defined types were placed in this encounter.  Meds ordered this encounter  Medications  . ketoconazole (NIZORAL) 2 % cream    Sig: Apply 1 application topically 2 (two) times daily as needed for irritation.    Dispense:  30 g    Refill:  0  . terbinafine (LAMISIL) 250 MG tablet    Sig: Take 1 tablet (250 mg total) by mouth daily.    Dispense:  14 tablet    Refill:  0    Return in about 2 weeks (around 06/24/2016) for recheck rash.   Robbye Dede Elayne Guerin, M.D. Primary Care at [facility] previously Urgent [place] [address] [place], [place]  [address] [phone] phone [phone] fax

## 2016-06-24 ENCOUNTER — Encounter: Payer: Self-pay | Admitting: Family Medicine

## 2016-06-24 ENCOUNTER — Ambulatory Visit (INDEPENDENT_AMBULATORY_CARE_PROVIDER_SITE_OTHER): Payer: BLUE CROSS/BLUE SHIELD | Admitting: Family Medicine

## 2016-06-24 VITALS — BP 135/90 | HR 70 | Temp 98.1°F | Resp 16 | Wt 140.2 lb

## 2016-06-24 DIAGNOSIS — R21 Rash and other nonspecific skin eruption: Secondary | ICD-10-CM

## 2016-06-24 DIAGNOSIS — I10 Essential (primary) hypertension: Secondary | ICD-10-CM

## 2016-06-24 DIAGNOSIS — E7800 Pure hypercholesterolemia, unspecified: Secondary | ICD-10-CM | POA: Diagnosis not present

## 2016-06-24 DIAGNOSIS — Z823 Family history of stroke: Secondary | ICD-10-CM | POA: Diagnosis not present

## 2016-06-24 MED ORDER — LOSARTAN POTASSIUM 50 MG PO TABS: 50.0000 mg | ORAL_TABLET | Freq: Every day | ORAL | 1 refills | Status: DC

## 2016-06-24 MED ORDER — ROSUVASTATIN CALCIUM 5 MG PO TABS
5.0000 mg | ORAL_TABLET | Freq: Every day | ORAL | 1 refills | Status: DC
Start: 2016-06-24 — End: 2016-12-18

## 2016-06-24 NOTE — Progress Notes (Signed)
Subjective:    Patient ID: Ashlee Davis, female    DOB: [birth year], 51 y.o.   MRN: [id number]  06/24/2016  Follow-up (rash)   HPI This 51 y.o. female presents for evaluation of rash.  At last visit, stopped Atorvastatin.   Pt also stopped HCTZ after last visit.  Needs new medication.  Rash has resolved with cessation of Atorvastatin and HCTZ.    Review of Systems  Constitutional: Negative for chills, diaphoresis, fatigue and fever.  HENT: Negative for ear pain, postnasal drip, rhinorrhea, sinus pressure, sore throat and trouble swallowing.   Respiratory: Negative for cough and shortness of breath.   Cardiovascular: Negative for chest pain, palpitations and leg swelling.  Gastrointestinal: Negative for abdominal pain, constipation, diarrhea, nausea and vomiting.   BP Readings from Last 3 Encounters:  06/24/16 135/90  06/10/16 135/90  05/07/16 130/82   Wt Readings from Last 3 Encounters:  06/24/16 140 lb 3.2 oz (63.6 kg)  06/10/16 140 lb 3.2 oz (63.6 kg)  05/07/16 140 lb 9.6 oz (63.8 kg)   Immunization History  Administered Date(s) Administered  . Tdap 07/01/2012     Past Medical History:  Diagnosis Date  . Allergy   . Arthritis   . Hypertension   . Nevus   . Other and unspecified hyperlipidemia    Past Surgical History:  Procedure Laterality Date  . NONE     Allergies  Allergen Reactions  . Wine [Alcohol]     Social History   Social History  . Marital status: Married    Spouse name: Ferninand  . Number of children: 0  . Years of education: 12   Occupational History  . homemaker    Social History Main Topics  . Smoking status: Never Smoker  . Smokeless tobacco: Never Used  . Alcohol use Yes     Comment: wine occasionally  . Drug use: No  . Sexual activity: Yes    Partners: Male   Other Topics Concern  . Not on file   Social History Narrative   Marital status: married since 1999.  Happily married; no abuse.      Children:  None   Lives: with husband; from [place]; moved to [place] to [place].   From the [place], left at age 49 for the [place].      Tobacco: never      Alcohol: socially but rare due to skin flushing.      Exercises:  Walking every morning; works in garden a lot.   Always uses seat belts. Smoke alarm in the home. No guns in the home.   Caffeine use: in moderation daily.   Family History  Problem Relation Age of Onset  . Hypertension Mother   . Heart disease Mother 33       AMI   . Hyperlipidemia Mother   . Hypertension Sister   . Hyperlipidemia Sister   . Stroke Sister   . Colon cancer Neg Hx   . Esophageal cancer Neg Hx   . Rectal cancer Neg Hx   . Stomach cancer Neg Hx   . Liver cancer Neg Hx        Objective:    BP 135/90 (BP Location: Right Arm, Patient Position: Sitting, Cuff Size: Normal)   Pulse 70   Temp 98.1 F (36.7 C) (Oral)   Resp 16   Wt 140 lb 3.2 oz (63.6 kg)   LMP 04/24/2016   SpO2 98%   BMI 28.32 kg/m  Physical Exam  Constitutional:  She is oriented to person, place, and time. She appears well-developed and well-nourished. No distress.  HENT:  Head: Normocephalic and atraumatic.  Eyes: Conjunctivae are normal. Pupils are equal, round, and reactive to light.  Neck: Normal range of motion. Neck supple.  Cardiovascular: Normal rate, regular rhythm and normal heart sounds.  Exam reveals no gallop and no friction rub.   No murmur heard. Pulmonary/Chest: Effort normal and breath sounds normal. She has no wheezes. She has no rales.  Neurological: She is alert and oriented to person, place, and time. No cranial nerve deficit. She exhibits normal muscle tone. Coordination normal.  Skin: Skin is warm and dry. No rash noted. She is not diaphoretic. No erythema. No pallor.  Psychiatric: She has a normal mood and affect. Her behavior is normal.  Nursing note and vitals reviewed.       Assessment & Plan:   1. Rash and nonspecific skin eruption   2. Essential  hypertension   3. Pure hypercholesterolemia   4. Family history of stroke    -rash has resolved. -rx for Losartan 50mg  daily for hypertension; possible allergic reaction to HCTZ. -rx for Crestor provided; possible reaction to Lipitor.  No orders of the defined types were placed in this encounter.  Meds ordered this encounter  Medications  . losartan (COZAAR) 50 MG tablet    Sig: Take 1 tablet (50 mg total) by mouth daily.    Dispense:  90 tablet    Refill:  1  . rosuvastatin (CRESTOR) 5 MG tablet    Sig: Take 1 tablet (5 mg total) by mouth daily.    Dispense:  90 tablet    Refill:  1    No Follow-up on file.   Julicia Krieger Elayne Guerin, M.D. Primary Care at [facility] previously Urgent [place] [address] [place], [place]  [address] [phone] phone [phone] fax

## 2016-09-12 DIAGNOSIS — K0889 Other specified disorders of teeth and supporting structures: Secondary | ICD-10-CM | POA: Diagnosis not present

## 2016-09-23 ENCOUNTER — Other Ambulatory Visit: Payer: Self-pay | Admitting: Family Medicine

## 2016-10-13 ENCOUNTER — Encounter: Payer: Self-pay | Admitting: Family Medicine

## 2016-10-13 ENCOUNTER — Ambulatory Visit (INDEPENDENT_AMBULATORY_CARE_PROVIDER_SITE_OTHER): Payer: BLUE CROSS/BLUE SHIELD | Admitting: Family Medicine

## 2016-10-13 VITALS — BP 138/88 | HR 71 | Temp 97.6°F | Resp 16 | Ht 61.42 in | Wt 140.0 lb

## 2016-10-13 DIAGNOSIS — Z823 Family history of stroke: Secondary | ICD-10-CM | POA: Diagnosis not present

## 2016-10-13 DIAGNOSIS — Z1239 Encounter for other screening for malignant neoplasm of breast: Secondary | ICD-10-CM

## 2016-10-13 DIAGNOSIS — I10 Essential (primary) hypertension: Secondary | ICD-10-CM | POA: Diagnosis not present

## 2016-10-13 DIAGNOSIS — E7800 Pure hypercholesterolemia, unspecified: Secondary | ICD-10-CM

## 2016-10-13 DIAGNOSIS — M25552 Pain in left hip: Secondary | ICD-10-CM

## 2016-10-13 DIAGNOSIS — Z1231 Encounter for screening mammogram for malignant neoplasm of breast: Secondary | ICD-10-CM

## 2016-10-13 LAB — POCT URINALYSIS DIP (MANUAL ENTRY)
Bilirubin, UA: NEGATIVE
GLUCOSE UA: NEGATIVE mg/dL
Ketones, POC UA: NEGATIVE mg/dL
Leukocytes, UA: NEGATIVE
Nitrite, UA: NEGATIVE
Protein Ur, POC: NEGATIVE mg/dL
SPEC GRAV UA: 1.02 (ref 1.010–1.025)
Urobilinogen, UA: 0.2 E.U./dL
pH, UA: 5 (ref 5.0–8.0)

## 2016-10-13 NOTE — Patient Instructions (Signed)
     IF you received an x-ray today, you will receive an invoice from [facility]. Please contact [facility] at [phone] with questions or concerns regarding your invoice.   IF you received labwork today, you will receive an invoice from [facility]. Please contact [facility] at [phone] with questions or concerns regarding your invoice.   Our billing staff will not be able to assist you with questions regarding bills from these companies.  You will be contacted with the lab results as soon as they are available. The fastest way to get your results is to activate your My Chart account. Instructions are located on the last page of this paperwork. If you have not heard from us regarding the results in 2 weeks, please contact this office.     

## 2016-10-13 NOTE — Progress Notes (Signed)
Subjective:    Patient ID: Ashlee Davis, female    DOB: October 11, 1965, 51 y.o.   MRN: [id number]  10/13/2016  Hypertension (3 month follow-up) and Hyperlipidemia   HPI This 51 y.o. female presents for three month follow-up of hypertension and hypercholesterolemia.  Management changes at last visit include adding Losartan 50mg  daily and Crestor 5mg  daily.  Home BP readings in   [place] to [place] in July; both patient and husband got horrible insect bites diffusely.  During last three week in August, had gum pain; went to [facility] provider[facility]clinic because local clinic closed; prescribed abx.  Developed diarrhea.    Last two days, diarrhea and gas, GERD improved.  Secondary to abx.  LMP in July very light.  Husband with indigestion lately; husband's brother dying of colon cancer and in hospice.   S/p colonoscopy in 03/2016.  Stopped Meloxicam due to diarrhea and skin irritation.   BP Readings from Last 3 Encounters:  10/13/16 138/88  06/24/16 135/90  06/10/16 135/90   Wt Readings from Last 3 Encounters:  10/13/16 140 lb (63.5 kg)  06/24/16 140 lb 3.2 oz (63.6 kg)  06/10/16 140 lb 3.2 oz (63.6 kg)   Immunization History  Administered Date(s) Administered  . Tdap 07/01/2012    Review of Systems  Constitutional: Negative for chills, diaphoresis, fatigue and fever.  Eyes: Negative for visual disturbance.  Respiratory: Negative for cough and shortness of breath.   Cardiovascular: Negative for chest pain, palpitations and leg swelling.  Gastrointestinal: Positive for diarrhea and nausea. Negative for abdominal distention, abdominal pain, anal bleeding, blood in stool, constipation, rectal pain and vomiting.  Endocrine: Negative for cold intolerance, heat intolerance, polydipsia, polyphagia and polyuria.  Skin: Positive for rash.  Neurological: Negative for dizziness, tremors, seizures, syncope, facial asymmetry, speech difficulty, weakness, light-headedness, numbness and headaches.    Psychiatric/Behavioral: Negative for dysphoric mood.    Past Medical History:  Diagnosis Date  . Allergy   . Arthritis   . Hypertension   . Nevus   . Other and unspecified hyperlipidemia    Past Surgical History:  Procedure Laterality Date  . NONE     Allergies  Allergen Reactions  . Wine [Alcohol]    Current Outpatient Prescriptions  Medication Sig Dispense Refill  . ketoconazole (NIZORAL) 2 % cream Apply 1 application topically 2 (two) times daily as needed for irritation. 30 g 0  . losartan (COZAAR) 50 MG tablet Take 1 tablet (50 mg total) by mouth daily. 90 tablet 1  . rosuvastatin (CRESTOR) 5 MG tablet Take 1 tablet (5 mg total) by mouth daily. 90 tablet 1  . meloxicam (MOBIC) 7.5 MG tablet TAKE ONE TABLET BY MOUTH DAILY (Patient not taking: Reported on 10/13/2016) 30 tablet 0   Current Facility-Administered Medications  Medication Dose Route Frequency Provider Last Rate Last Dose  . 0.9 %  sodium chloride infusion  500 mL Intravenous Continuous Irene Shipper, MD       Social History   Social History  . Marital status: Married    Spouse name: Ferninand  . Number of children: 0  . Years of education: 12   Occupational History  . homemaker    Social History Main Topics  . Smoking status: Never Smoker  . Smokeless tobacco: Never Used  . Alcohol use Yes     Comment: wine occasionally  . Drug use: No  . Sexual activity: Yes    Partners: Male   Other Topics Concern  . Not on file  Social History Narrative   Marital status: married since 1999.  Happily married; no abuse.      Children:  None      Lives: with husband; from [place]; moved to [place] to [place].   From the [place], left at age 83 for the [place].      Tobacco: never      Alcohol: socially but rare due to skin flushing.      Exercises:  Walking every morning; works in garden a lot.   Always uses seat belts. Smoke alarm in the home. No guns in the home.   Caffeine use: in moderation daily.    Family History  Problem Relation Age of Onset  . Hypertension Mother   . Heart disease Mother 65       AMI   . Hyperlipidemia Mother   . Hypertension Sister   . Hyperlipidemia Sister   . Stroke Sister   . Colon cancer Neg Hx   . Esophageal cancer Neg Hx   . Rectal cancer Neg Hx   . Stomach cancer Neg Hx   . Liver cancer Neg Hx        Objective:    BP 138/88   Pulse 71   Temp 97.6 F (36.4 C) (Oral)   Resp 16   Ht 5' 1.42" (1.56 m)   Wt 140 lb (63.5 kg)   SpO2 97%   BMI 26.09 kg/m  Physical Exam  Constitutional: She is oriented to person, place, and time. She appears well-developed and well-nourished. No distress.  HENT:  Head: Normocephalic and atraumatic.  Right Ear: External ear normal.  Left Ear: External ear normal.  Nose: Nose normal.  Mouth/Throat: Oropharynx is clear and moist.  Eyes: Pupils are equal, round, and reactive to light. Conjunctivae and EOM are normal.  Neck: Normal range of motion. Neck supple. Carotid bruit is not present. No thyromegaly present.  Cardiovascular: Normal rate, regular rhythm, normal heart sounds and intact distal pulses.  Exam reveals no gallop and no friction rub.   No murmur heard. Pulmonary/Chest: Effort normal and breath sounds normal. She has no wheezes. She has no rales.  Abdominal: Soft. Bowel sounds are normal. She exhibits no distension and no mass. There is no tenderness. There is no rebound and no guarding.  Musculoskeletal:       Left hip: Normal. She exhibits normal range of motion, normal strength, no tenderness and no bony tenderness.  Lymphadenopathy:    She has no cervical adenopathy.  Neurological: She is alert and oriented to person, place, and time. No cranial nerve deficit.  Skin: Skin is warm and dry. Rash noted. She is not diaphoretic. No erythema. No pallor.  Faint healing rash along abdomen, extremities.  Psychiatric: She has a normal mood and affect. Her behavior is normal.    No results  found. Depression screen [facility] 2/9 10/13/2016 06/24/2016 06/10/2016 05/07/2016 04/08/2016  Decreased Interest 0 0 0 0 0  Down, Depressed, Hopeless 0 0 0 0 0  PHQ - 2 Score 0 0 0 0 0   Fall Risk  10/13/2016 06/24/2016 06/10/2016 05/07/2016 04/08/2016  Falls in the past year? No No No No No        Assessment & Plan:   1. Essential hypertension   2. Pure hypercholesterolemia   3. Family history of stroke   4. Breast cancer screening   5. Pain of left hip joint    -hypertension controlled; obtain labs;continue current medications. -obtain FLP; continue Crestor.  -recommend  mammogram for breast cancer screening. -recommend weight loss, exercise for 30-60 minutes five days per week; recommend 1200 kcal restriction per day with a minimum of 60 grams of protein per day. -PATIENT REFUSED FLU VACCINE.   Orders Placed This Encounter  Procedures  . MM DIGITAL SCREENING BILATERAL    Standing Status:   Future    Standing Expiration Date:   12/13/2017    Order Specific Question:   Reason for Exam (SYMPTOM  OR DIAGNOSIS REQUIRED)    Answer:   screening for breast cancer    Order Specific Question:   Is the patient pregnant?    Answer:   No    Order Specific Question:   Preferred imaging location?    Answer:   [facility]  . CBC with Differential/Platelet  . Comprehensive metabolic panel    Order Specific Question:   Has the patient fasted?    Answer:   Yes  . Lipid panel    Order Specific Question:   Has the patient fasted?    Answer:   Yes  . Urine Microscopic  . POCT urinalysis dipstick   No orders of the defined types were placed in this encounter.   No Follow-up on file.   Takoya Jonas Elayne Guerin, M.D. Primary Care at [facility] previously [facility] [address] [place], [place]  [address] [phone] phone [phone] fax

## 2016-10-14 LAB — COMPREHENSIVE METABOLIC PANEL
ALK PHOS: 73 IU/L (ref 39–117)
ALT: 18 IU/L (ref 0–32)
AST: 20 IU/L (ref 0–40)
Albumin/Globulin Ratio: 1.7 (ref 1.2–2.2)
Albumin: 4.8 g/dL (ref 3.5–5.5)
BUN/Creatinine Ratio: 15 (ref 9–23)
BUN: 12 mg/dL (ref 6–24)
Bilirubin Total: 0.8 mg/dL (ref 0.0–1.2)
CO2: 19 mmol/L — AB (ref 20–29)
CREATININE: 0.8 mg/dL (ref 0.57–1.00)
Calcium: 9.7 mg/dL (ref 8.7–10.2)
Chloride: 105 mmol/L (ref 96–106)
GFR calc Af Amer: 99 mL/min/{1.73_m2} (ref >59)
GFR calc non Af Amer: 86 mL/min/{1.73_m2} (ref >59)
GLUCOSE: 96 mg/dL (ref 65–99)
Globulin, Total: 2.8 g/dL (ref 1.5–4.5)
Potassium: 4.1 mmol/L (ref 3.5–5.2)
Sodium: 146 mmol/L — ABNORMAL HIGH (ref 134–144)
Total Protein: 7.6 g/dL (ref 6.0–8.5)

## 2016-10-14 LAB — URINALYSIS, MICROSCOPIC ONLY: CASTS: NONE SEEN /LPF

## 2016-10-14 LAB — LIPID PANEL
CHOLESTEROL TOTAL: 161 mg/dL (ref 100–199)
Chol/HDL Ratio: 3.1 ratio (ref 0.0–4.4)
HDL: 52 mg/dL (ref >39)
LDL CALC: 74 mg/dL (ref 0–99)
TRIGLYCERIDES: 174 mg/dL — AB (ref 0–149)
VLDL CHOLESTEROL CAL: 35 mg/dL (ref 5–40)

## 2016-10-14 LAB — CBC WITH DIFFERENTIAL/PLATELET
BASOS ABS: 0 10*3/uL (ref 0.0–0.2)
BASOS: 1 %
EOS (ABSOLUTE): 0.1 10*3/uL (ref 0.0–0.4)
Eos: 2 %
HEMOGLOBIN: 14.4 g/dL (ref 11.1–15.9)
Hematocrit: 42.8 % (ref 34.0–46.6)
Immature Grans (Abs): 0 10*3/uL (ref 0.0–0.1)
Immature Granulocytes: 0 %
LYMPHS ABS: 2.7 10*3/uL (ref 0.7–3.1)
Lymphs: 44 %
MCH: 31.2 pg (ref 26.6–33.0)
MCHC: 33.6 g/dL (ref 31.5–35.7)
MCV: 93 fL (ref 79–97)
Monocytes Absolute: 0.3 10*3/uL (ref 0.1–0.9)
Monocytes: 4 %
NEUTROS ABS: 3.1 10*3/uL (ref 1.4–7.0)
Neutrophils: 49 %
Platelets: 254 10*3/uL (ref 150–379)
RBC: 4.61 x10E6/uL (ref 3.77–5.28)
RDW: 14.1 % (ref 12.3–15.4)
WBC: 6.1 10*3/uL (ref 3.4–10.8)

## 2016-10-26 ENCOUNTER — Other Ambulatory Visit: Payer: Self-pay | Admitting: Family Medicine

## 2016-10-26 ENCOUNTER — Telehealth: Payer: Self-pay | Admitting: Family Medicine

## 2016-10-26 NOTE — Telephone Encounter (Signed)
Call for details --- is patient having a lot of gas below or a lot of belching/burching?  Is she having diarrhea, nausea, or vomiting or constipation?  Any abdominal pain?

## 2016-10-26 NOTE — Telephone Encounter (Signed)
Pt states that she is having bad gas and would like you to call in something for this issue. She also states that she had this same problem last year.  She still use [facility] Oppen[facility].

## 2016-10-27 NOTE — Telephone Encounter (Signed)
Spoke with pt this afternoon about her gas she states she is not having any other symptoms, no N/V, no constipation, no abdominal pain, or diarrhea. She states it is just Gas that will not stop and it happens more when she eats Glutin Free foods.

## 2016-10-28 NOTE — Telephone Encounter (Signed)
Left message with husband that pt will need to schedule office visit in order to be treated and consider antibiotic treatment.

## 2016-10-28 NOTE — Telephone Encounter (Signed)
PATIENT WALKED INTO OUR OFFICE STATING THAT SHE HAS HAD FULL BLOWN DIARRHEA AND NEED AN ANTIBIOTIC

## 2016-12-17 ENCOUNTER — Other Ambulatory Visit: Payer: Self-pay | Admitting: Family Medicine

## 2016-12-18 ENCOUNTER — Other Ambulatory Visit: Payer: Self-pay | Admitting: Family Medicine

## 2017-04-12 ENCOUNTER — Encounter: Payer: Self-pay | Admitting: Family Medicine

## 2017-04-12 ENCOUNTER — Ambulatory Visit (INDEPENDENT_AMBULATORY_CARE_PROVIDER_SITE_OTHER): Payer: BLUE CROSS/BLUE SHIELD | Admitting: Family Medicine

## 2017-04-12 ENCOUNTER — Other Ambulatory Visit: Payer: Self-pay

## 2017-04-12 VITALS — BP 122/72 | HR 82 | Temp 97.8°F | Resp 16 | Ht 61.61 in | Wt 143.0 lb

## 2017-04-12 DIAGNOSIS — E7800 Pure hypercholesterolemia, unspecified: Secondary | ICD-10-CM

## 2017-04-12 DIAGNOSIS — M25541 Pain in joints of right hand: Secondary | ICD-10-CM | POA: Diagnosis not present

## 2017-04-12 DIAGNOSIS — Z823 Family history of stroke: Secondary | ICD-10-CM

## 2017-04-12 DIAGNOSIS — R140 Abdominal distension (gaseous): Secondary | ICD-10-CM | POA: Diagnosis not present

## 2017-04-12 DIAGNOSIS — N951 Menopausal and female climacteric states: Secondary | ICD-10-CM | POA: Diagnosis not present

## 2017-04-12 DIAGNOSIS — I10 Essential (primary) hypertension: Secondary | ICD-10-CM | POA: Diagnosis not present

## 2017-04-12 DIAGNOSIS — M25542 Pain in joints of left hand: Secondary | ICD-10-CM | POA: Diagnosis not present

## 2017-04-12 LAB — COMPREHENSIVE METABOLIC PANEL
ALBUMIN: 4.5 g/dL (ref 3.5–5.5)
ALT: 20 IU/L (ref 0–32)
AST: 23 IU/L (ref 0–40)
Albumin/Globulin Ratio: 2 (ref 1.2–2.2)
Alkaline Phosphatase: 80 IU/L (ref 39–117)
BILIRUBIN TOTAL: 0.9 mg/dL (ref 0.0–1.2)
BUN / CREAT RATIO: 25 — AB (ref 9–23)
BUN: 14 mg/dL (ref 6–24)
CALCIUM: 9.4 mg/dL (ref 8.7–10.2)
CHLORIDE: 105 mmol/L (ref 96–106)
CO2: 20 mmol/L (ref 20–29)
Creatinine, Ser: 0.55 mg/dL — ABNORMAL LOW (ref 0.57–1.00)
GFR, EST AFRICAN AMERICAN: 126 mL/min/{1.73_m2} (ref >59)
GFR, EST NON AFRICAN AMERICAN: 110 mL/min/{1.73_m2} (ref >59)
GLUCOSE: 94 mg/dL (ref 65–99)
Globulin, Total: 2.3 g/dL (ref 1.5–4.5)
Potassium: 4.1 mmol/L (ref 3.5–5.2)
Sodium: 141 mmol/L (ref 134–144)
TOTAL PROTEIN: 6.8 g/dL (ref 6.0–8.5)

## 2017-04-12 LAB — CBC WITH DIFFERENTIAL/PLATELET
BASOS ABS: 0 10*3/uL (ref 0.0–0.2)
Basos: 0 %
EOS (ABSOLUTE): 0.1 10*3/uL (ref 0.0–0.4)
EOS: 2 %
Hematocrit: 41.8 % (ref 34.0–46.6)
Hemoglobin: 14.3 g/dL (ref 11.1–15.9)
IMMATURE GRANULOCYTES: 0 %
Immature Grans (Abs): 0 10*3/uL (ref 0.0–0.1)
LYMPHS ABS: 2.5 10*3/uL (ref 0.7–3.1)
Lymphs: 34 %
MCH: 32.6 pg (ref 26.6–33.0)
MCHC: 34.2 g/dL (ref 31.5–35.7)
MCV: 95 fL (ref 79–97)
Monocytes Absolute: 0.4 10*3/uL (ref 0.1–0.9)
Monocytes: 5 %
NEUTROS PCT: 59 %
Neutrophils Absolute: 4.2 10*3/uL (ref 1.4–7.0)
PLATELETS: 225 10*3/uL (ref 150–379)
RBC: 4.39 x10E6/uL (ref 3.77–5.28)
RDW: 13.7 % (ref 12.3–15.4)
WBC: 7.2 10*3/uL (ref 3.4–10.8)

## 2017-04-12 LAB — LIPID PANEL
CHOL/HDL RATIO: 2.6 ratio (ref 0.0–4.4)
Cholesterol, Total: 142 mg/dL (ref 100–199)
HDL: 55 mg/dL (ref >39)
LDL CALC: 63 mg/dL (ref 0–99)
Triglycerides: 121 mg/dL (ref 0–149)
VLDL CHOLESTEROL CAL: 24 mg/dL (ref 5–40)

## 2017-04-12 MED ORDER — ROSUVASTATIN CALCIUM 5 MG PO TABS: 5.0000 mg | ORAL_TABLET | Freq: Every day | ORAL | 1 refills | Status: DC

## 2017-04-12 MED ORDER — LOSARTAN POTASSIUM 50 MG PO TABS: 50.0000 mg | ORAL_TABLET | Freq: Every day | ORAL | 1 refills | Status: DC

## 2017-04-12 MED ORDER — MELOXICAM 7.5 MG PO TABS: 7.5000 mg | ORAL_TABLET | Freq: Every day | ORAL | 0 refills | Status: DC

## 2017-04-12 NOTE — Patient Instructions (Addendum)
IF you received an x-ray today, you will receive an invoice from [facility]. Please contact [facility] at [phone] with questions or concerns regarding your invoice.   IF you received labwork today, you will receive an invoice from [facility]. Please contact [facility] at [phone] with questions or concerns regarding your invoice.   Our billing staff will not be able to assist you with questions regarding bills from these companies.  You will be contacted with the lab results as soon as they are available. The fastest way to get your results is to activate your My Chart account. Instructions are located on the last page of this paperwork. If you have not heard from [place] regarding the results in 2 weeks, please contact this office.      Abdominal Bloating When you have abdominal bloating, your abdomen may feel full, tight, or painful. It may also look bigger than normal or swollen (distended). Common causes of abdominal bloating include:  Swallowing air.  Constipation.  Problems digesting food.  Eating too much.  Irritable bowel syndrome. This is a condition that affects the large intestine.  Lactose intolerance. This is an inability to digest lactose, a natural sugar in dairy products.  Celiac disease. This is a condition that affects the ability to digest gluten, a protein found in some grains.  Gastroparesis. This is a condition that slows down the movement of food in the stomach and small intestine. It is more common in people with diabetes mellitus.  Gastroesophageal reflux disease (GERD). This is a digestive condition that makes stomach acid flow back into the esophagus.  Urinary retention. This means that the body is holding onto urine, and the bladder cannot be emptied all the way.  Follow these instructions at home: Eating and drinking  Avoid eating too much.  Try not to swallow air while talking or eating.  Avoid eating while lying  down.  Avoid these foods and drinks: ? Foods that cause gas, such as broccoli, cabbage, cauliflower, and baked beans. ? Carbonated drinks. ? Hard candy. ? Chewing gum. Medicines  Take over-the-counter and prescription medicines only as told by your health care provider.  Take probiotic medicines. These medicines contain live bacteria or yeasts that can help digestion.  Take coated peppermint oil capsules. Activity  Try to exercise regularly. Exercise may help to relieve bloating that is caused by gas and relieve constipation. General instructions  Keep all follow-up visits as told by your health care provider. This is important. Contact a health care provider if:  You have nausea and vomiting.  You have diarrhea.  You have abdominal pain.  You have unusual weight loss or weight gain.  You have severe pain, and medicines do not help. Get help right away if:  You have severe chest pain.  You have trouble breathing.  You have shortness of breath.  You have trouble urinating.  You have darker urine than normal.  You have blood in your stools or have dark, tarry stools. Summary  Abdominal bloating means that the abdomen is swollen.  Common causes of abdominal bloating are swallowing air, constipation, and problems digesting food.  Avoid eating too much and avoid swallowing air.  Avoid foods that cause gas, carbonated drinks, hard candy, and chewing gum. This information is not intended to replace advice given to you by your health care provider. Make sure you discuss any questions you have with your health care provider. Document Released: 02/21/2016 Document Revised: 02/21/2016 Document Reviewed: 02/21/2016 Elsevier  Interactive Patient Education  [employer].

## 2017-04-12 NOTE — Progress Notes (Signed)
Subjective:    Patient ID: Ashlee Davis, female    DOB: 24-Aug-1965, 52 y.o.   MRN: [id number]  04/12/2017  Hypertension (follow-up )    HPI This 52 y.o. female presents for six month follow-up of hypertension and hypercholesterolemia. Management changes made at last visit include the following: -hypertension controlled; obtain labs;continue current medications. -obtain FLP; continue Crestor.  -recommend mammogram for breast cancer screening. -recommend weight loss, exercise for 30-60 minutes five days per week; recommend 1200 kcal restriction per day with a minimum of 60 grams of protein per day. -PATIENT REFUSED FLU VACCINE. -Cholesterol is under good control.  -Liver and kidney functions are normal. -urine is normal.    Ferdi's brother died in 2017/02/24.   Ferdi had stent done two weeks ago at [facility].   Last week, ear was ringing; no abnormalities; needed cleaning.  Upcoming appointment next month. Patient going to [place] next month. Needs travel medicine.  Diarrhea in August, September, October.  No recurrent diarrhea since October.  Gas passing 30 times per day.  If eats fish and vegetables.  Low digestion.  If eats pickled okra; does not like pickled okra.  Will eat pickle okra works well.  BP at home running good. Eating tuna in cans with rice.  Bloating and gas.   Has aching pains sporadically. Exercising every day on machine; hates condos.   Goes out early morning to go to store.  Menopause with excessive sweating.   Hand pain; joint pain. Not happy with 44s.   Daily bowel movement; no straining.   Sister with CVA not doing well; no longer giving medication.  Bought charcoal supplement with improvement; gas has improved.    BP Readings from Last 3 Encounters:  04/12/17 122/72  10/13/16 138/88  06/24/16 135/90   Wt Readings from Last 3 Encounters:  04/12/17 143 lb (64.9 kg)  10/13/16 140 lb (63.5 kg)  06/24/16 140 lb 3.2 oz (63.6 kg)    Immunization History  Administered Date(s) Administered  . Tdap 07/01/2012    Review of Systems  Constitutional: Negative for chills, diaphoresis, fatigue and fever.  Eyes: Negative for visual disturbance.  Respiratory: Negative for cough and shortness of breath.   Cardiovascular: Negative for chest pain, palpitations and leg swelling.  Gastrointestinal: Negative for abdominal distention, abdominal pain, anal bleeding, blood in stool, constipation, diarrhea, nausea and vomiting.  Endocrine: Negative for cold intolerance, heat intolerance, polydipsia, polyphagia and polyuria.  Neurological: Negative for dizziness, tremors, seizures, syncope, facial asymmetry, speech difficulty, weakness, light-headedness, numbness and headaches.    Past Medical History:  Diagnosis Date  . Allergy   . Arthritis   . Hypertension   . Nevus   . Other and unspecified hyperlipidemia    Past Surgical History:  Procedure Laterality Date  . NONE     Allergies  Allergen Reactions  . Wine [Alcohol]    No current outpatient medications on file prior to visit.   Current Facility-Administered Medications on File Prior to Visit  Medication Dose Route Frequency Provider Last Rate Last Dose  . 0.9 %  sodium chloride infusion  500 mL Intravenous Continuous Irene Shipper, MD       Social History   Socioeconomic History  . Marital status: Married    Spouse name: Ferninand  . Number of children: 0  . Years of education: 54  . Highest education level: Not on file  Occupational History  . Occupation: homemaker  Social Needs  . Financial resource strain: Not  on file  . Food insecurity:    Worry: Not on file    Inability: Not on file  . Transportation needs:    Medical: Not on file    Non-medical: Not on file  Tobacco Use  . Smoking status: Never Smoker  . Smokeless tobacco: Never Used  Substance and Sexual Activity  . Alcohol use: Yes    Comment: wine occasionally  . Drug use: No  . Sexual  activity: Yes    Partners: Male  Lifestyle  . Physical activity:    Days per week: Not on file    Minutes per session: Not on file  . Stress: Not on file  Relationships  . Social connections:    Talks on phone: Not on file    Gets together: Not on file    Attends religious service: Not on file    Active member of club or organization: Not on file    Attends meetings of clubs or organizations: Not on file    Relationship status: Not on file  . Intimate partner violence:    Fear of current or ex partner: Not on file    Emotionally abused: Not on file    Physically abused: Not on file    Forced sexual activity: Not on file  Other Topics Concern  . Not on file  Social History Narrative   Marital status: married since 1999.  Happily married; no abuse.      Children:  None      Lives: with husband; from [place]; moved to [place] to [place].   From the [place], left at age 43 for the [place].      Tobacco: never      Alcohol: socially but rare due to skin flushing.      Exercises:  Walking every morning; works in garden a lot.   Always uses seat belts. Smoke alarm in the home. No guns in the home.   Caffeine use: in moderation daily.   Family History  Problem Relation Age of Onset  . Hypertension Mother   . Heart disease Mother 53       AMI   . Hyperlipidemia Mother   . Hypertension Sister   . Hyperlipidemia Sister   . Stroke Sister   . Colon cancer Neg Hx   . Esophageal cancer Neg Hx   . Rectal cancer Neg Hx   . Stomach cancer Neg Hx   . Liver cancer Neg Hx        Objective:    BP 122/72   Pulse 82   Temp 97.8 F (36.6 C) (Oral)   Resp 16   Ht 5' 1.61" (1.565 m)   Wt 143 lb (64.9 kg)   SpO2 98%   BMI 26.48 kg/m  Physical Exam  Constitutional: She is oriented to person, place, and time. She appears well-developed and well-nourished. No distress.  HENT:  Head: Normocephalic and atraumatic.  Right Ear: External ear normal.  Left Ear: External ear normal.    Nose: Nose normal.  Mouth/Throat: Oropharynx is clear and moist.  Eyes: Conjunctivae and EOM are normal. Pupils are equal, round, and reactive to light.  Neck: Normal range of motion. Neck supple. Carotid bruit is not present. No thyromegaly present.  Cardiovascular: Normal rate, regular rhythm, normal heart sounds and intact distal pulses. Exam reveals no gallop and no friction rub.  No murmur heard. Pulmonary/Chest: Effort normal and breath sounds normal. She has no wheezes. She has no rales.  Abdominal: Soft.  Bowel sounds are normal. She exhibits no distension and no mass. There is no tenderness. There is no rebound and no guarding.  Lymphadenopathy:    She has no cervical adenopathy.  Neurological: She is alert and oriented to person, place, and time. No cranial nerve deficit.  Skin: Skin is warm and dry. No rash noted. She is not diaphoretic. No erythema. No pallor.  Psychiatric: She has a normal mood and affect. Her behavior is normal.   No results found. Depression screen [facility] 2/9 04/12/2017 10/13/2016 06/24/2016 06/10/2016 05/07/2016  Decreased Interest 0 0 0 0 0  Down, Depressed, Hopeless 0 0 0 0 0  PHQ - 2 Score 0 0 0 0 0   Fall Risk  04/12/2017 10/13/2016 06/24/2016 06/10/2016 05/07/2016  Falls in the past year? No No No No No        Assessment & Plan:   1. Essential hypertension   2. Pure hypercholesterolemia   3. Family history of stroke   4. Arthralgia of both hands   5. Menopausal hot flushes   6. Abdominal bloating    Hypertension and hypercholesterolemia: well controlled; obtain labs; refills provided. Arthralgias of B hands: requesting Meloxicam for PRN use.  Obtain imaging at next visit if persistent. Menopause hot flashes: New onset; intermittent; reassurance provided;anticipatory guidance.  Abdominal bloating: recurrent and intermittent.  Dietary modification with improvement.  Recommend follow-up with GI specialist; s/p colonoscopy January 2018.  Orders Placed This  Encounter  Procedures  . Comprehensive metabolic panel  . Lipid panel  . CBC with Differential/Platelet   Meds ordered this encounter  Medications  . losartan (COZAAR) 50 MG tablet    Sig: Take 1 tablet (50 mg total) by mouth daily.    Dispense:  90 tablet    Refill:  1  . rosuvastatin (CRESTOR) 5 MG tablet    Sig: Take 1 tablet (5 mg total) by mouth daily.    Dispense:  90 tablet    Refill:  1  . meloxicam (MOBIC) 7.5 MG tablet    Sig: Take 1 tablet (7.5 mg total) by mouth daily.    Dispense:  90 tablet    Refill:  0    Return in about 6 months (around 10/13/2017) for complete physical examiniation.   Reinette Cuneo Elayne Guerin, M.D. Primary Care at [facility] previously [facility] [address] [place], [place]  [address] [phone] phone [phone] fax

## 2017-06-16 DIAGNOSIS — N760 Acute vaginitis: Secondary | ICD-10-CM | POA: Diagnosis not present

## 2017-06-24 ENCOUNTER — Encounter: Payer: Self-pay | Admitting: Family Medicine

## 2017-07-01 ENCOUNTER — Telehealth: Payer: Self-pay | Admitting: Family Medicine

## 2017-07-01 NOTE — Telephone Encounter (Signed)
I tried reaching Ashlee Davis in regards to cancelling/rescheduling her appt with Dr. Tamala Julian on 10/25/2017 due to provider leaving. Her voicemail box was full so was not able to leave a message.

## 2017-07-08 ENCOUNTER — Other Ambulatory Visit: Payer: Self-pay | Admitting: Family Medicine

## 2017-07-19 ENCOUNTER — Telehealth: Payer: Self-pay | Admitting: Family Medicine

## 2017-07-19 NOTE — Telephone Encounter (Signed)
Pt stopped by the office and wanted to know the results of her blood tests that she had done on 04/12/17. She says that she has not received them and would like to know the results.   Please send her results by mail to her address - I checked with the pt and address on file is correct.  She will be following Dr. Tamala Julian to her new practice with [facility].   Thanks!

## 2017-08-01 ENCOUNTER — Encounter: Payer: Self-pay | Admitting: Family Medicine

## 2017-08-01 NOTE — Telephone Encounter (Signed)
Please print of letter from labs from 04/12/17 and send copy to patient.

## 2017-08-02 NOTE — Telephone Encounter (Signed)
Letter sent.

## 2017-10-01 DIAGNOSIS — D225 Melanocytic nevi of trunk: Secondary | ICD-10-CM | POA: Diagnosis not present

## 2017-10-05 ENCOUNTER — Encounter: Payer: Self-pay | Admitting: Physician Assistant

## 2017-10-05 ENCOUNTER — Other Ambulatory Visit: Payer: Self-pay

## 2017-10-05 ENCOUNTER — Ambulatory Visit: Payer: BLUE CROSS/BLUE SHIELD | Admitting: Physician Assistant

## 2017-10-05 VITALS — BP 128/82 | HR 68 | Temp 98.1°F | Resp 16 | Ht 60.0 in | Wt 142.8 lb

## 2017-10-05 DIAGNOSIS — E785 Hyperlipidemia, unspecified: Secondary | ICD-10-CM | POA: Diagnosis not present

## 2017-10-05 DIAGNOSIS — I10 Essential (primary) hypertension: Secondary | ICD-10-CM | POA: Diagnosis not present

## 2017-10-05 MED ORDER — ROSUVASTATIN CALCIUM 5 MG PO TABS: 5.0000 mg | ORAL_TABLET | Freq: Every day | ORAL | 1 refills | Status: DC

## 2017-10-05 MED ORDER — LOSARTAN POTASSIUM 50 MG PO TABS: 50.0000 mg | ORAL_TABLET | Freq: Every day | ORAL | 1 refills | Status: DC

## 2017-10-05 NOTE — Patient Instructions (Addendum)
It was nice meeting you today.  Come back in 6-12 months for annual exam.    DASH Eating Plan DASH stands for "Dietary Approaches to Stop Hypertension." The DASH eating plan is a healthy eating plan that has been shown to reduce high blood pressure (hypertension). It may also reduce your risk for type 2 diabetes, heart disease, and stroke. The DASH eating plan may also help with weight loss. What are tips for following this plan? General guidelines  Avoid eating more than 2,300 mg (milligrams) of salt (sodium) a day. If you have hypertension, you may need to reduce your sodium intake to 1,500 mg a day.  Limit alcohol intake to no more than 1 drink a day for nonpregnant women and 2 drinks a day for men. One drink equals 12 oz of beer, 5 oz of wine, or 1 oz of hard liquor.  Work with your health care provider to maintain a healthy body weight or to lose weight. Ask what an ideal weight is for you.  Get at least 30 minutes of exercise that causes your heart to beat faster (aerobic exercise) most days of the week. Activities may include walking, swimming, or biking.  Work with your health care provider or diet and nutrition specialist (dietitian) to adjust your eating plan to your individual calorie needs. Reading food labels  Check food labels for the amount of sodium per serving. Choose foods with less than 5 percent of the Daily Value of sodium. Generally, foods with less than 300 mg of sodium per serving fit into this eating plan.  To find whole grains, look for the word "whole" as the first word in the ingredient list. Shopping  Buy products labeled as "low-sodium" or "no salt added."  Buy fresh foods. Avoid canned foods and premade or frozen meals. Cooking  Avoid adding salt when cooking. Use salt-free seasonings or herbs instead of table salt or sea salt. Check with your health care provider or pharmacist before using salt substitutes.  Do not fry foods. Cook foods using healthy  methods such as baking, boiling, grilling, and broiling instead.  Cook with heart-healthy oils, such as olive, canola, soybean, or sunflower oil. Meal planning   Eat a balanced diet that includes: ? 5 or more servings of fruits and vegetables each day. At each meal, try to fill half of your plate with fruits and vegetables. ? Up to 6-8 servings of whole grains each day. ? Less than 6 oz of lean meat, poultry, or fish each day. A 3-oz serving of meat is about the same size as a deck of cards. One egg equals 1 oz. ? 2 servings of low-fat dairy each day. ? A serving of nuts, seeds, or beans 5 times each week. ? Heart-healthy fats. Healthy fats called Omega-3 fatty acids are found in foods such as flaxseeds and coldwater fish, like sardines, salmon, and mackerel.  Limit how much you eat of the following: ? Canned or prepackaged foods. ? Food that is high in trans fat, such as fried foods. ? Food that is high in saturated fat, such as fatty meat. ? Sweets, desserts, sugary drinks, and other foods with added sugar. ? Full-fat dairy products.  Do not salt foods before eating.  Try to eat at least 2 vegetarian meals each week.  Eat more home-cooked food and less restaurant, buffet, and fast food.  When eating at a restaurant, ask that your food be prepared with less salt or no salt, if possible. What  foods are recommended? The items listed Federici not be a complete list. Talk with your dietitian about what dietary choices are best for you. Grains Whole-grain or whole-wheat bread. Whole-grain or whole-wheat pasta. Brown rice. [place] Morrow. Bulgur. Whole-grain and low-sodium cereals. Pita bread. Low-fat, low-sodium crackers. Whole-wheat flour tortillas. Vegetables Fresh or frozen vegetables (raw, steamed, roasted, or grilled). Low-sodium or reduced-sodium tomato and vegetable juice. Low-sodium or reduced-sodium tomato sauce and tomato paste. Low-sodium or reduced-sodium canned  vegetables. Fruits All fresh, dried, or frozen fruit. Canned fruit in natural juice (without added sugar). Meat and other protein foods Skinless chicken or [place]. Ground chicken or [place]. Pork with fat trimmed off. Fish and seafood. Egg whites. Dried beans, peas, or lentils. Unsalted nuts, nut butters, and seeds. Unsalted canned beans. Lean cuts of beef with fat trimmed off. Low-sodium, lean deli meat. Dairy Low-fat (1%) or fat-free (skim) milk. Fat-free, low-fat, or reduced-fat cheeses. Nonfat, low-sodium ricotta or cottage cheese. Low-fat or nonfat yogurt. Low-fat, low-sodium cheese. Fats and oils Soft margarine without trans fats. Vegetable oil. Low-fat, reduced-fat, or light mayonnaise and salad dressings (reduced-sodium). Canola, safflower, olive, soybean, and sunflower oils. Avocado. Seasoning and other foods Herbs. Spices. Seasoning mixes without salt. Unsalted popcorn and pretzels. Fat-free sweets. What foods are not recommended? The items listed Kubin not be a complete list. Talk with your dietitian about what dietary choices are best for you. Grains Baked goods made with fat, such as croissants, muffins, or some breads. Dry pasta or rice meal packs. Vegetables Creamed or fried vegetables. Vegetables in a cheese sauce. Regular canned vegetables (not low-sodium or reduced-sodium). Regular canned tomato sauce and paste (not low-sodium or reduced-sodium). Regular tomato and vegetable juice (not low-sodium or reduced-sodium). Angie Fava. Olives. Fruits Canned fruit in a light or heavy syrup. Fried fruit. Fruit in cream or butter sauce. Meat and other protein foods Fatty cuts of meat. Ribs. Fried meat. Berniece Salines. Sausage. Bologna and other processed lunch meats. Salami. Fatback. Hotdogs. Bratwurst. Salted nuts and seeds. Canned beans with added salt. Canned or smoked fish. Whole eggs or egg yolks. Chicken or [place] with skin. Dairy Whole or 2% milk, cream, and half-and-half. Whole or full-fat  cream cheese. Whole-fat or sweetened yogurt. Full-fat cheese. Nondairy creamers. Whipped toppings. Processed cheese and cheese spreads. Fats and oils Butter. Stick margarine. Lard. Shortening. Ghee. Bacon fat. Tropical oils, such as coconut, palm kernel, or palm oil. Seasoning and other foods Salted popcorn and pretzels. Onion salt, garlic salt, seasoned salt, table salt, and sea salt. Worcestershire sauce. Tartar sauce. Barbecue sauce. Teriyaki sauce. Soy sauce, including reduced-sodium. Steak sauce. Canned and packaged gravies. Fish sauce. Oyster sauce. Cocktail sauce. Horseradish that you find on the shelf. Ketchup. Mustard. Meat flavorings and tenderizers. Bouillon cubes. Hot sauce and Tabasco sauce. Premade or packaged marinades. Premade or packaged taco seasonings. Relishes. Regular salad dressings. Where to find more information:  National Heart, Lung, and [place]: https://wilson-eaton.com/  American Heart Association: www.heart.org Summary  The DASH eating plan is a healthy eating plan that has been shown to reduce high blood pressure (hypertension). It Kitzmiller also reduce your risk for type 2 diabetes, heart disease, and stroke.  With the DASH eating plan, you should limit salt (sodium) intake to 2,300 mg a day. If you have hypertension, you Mccaughey need to reduce your sodium intake to 1,500 mg a day.  When on the DASH eating plan, aim to eat more fresh fruits and vegetables, whole grains, lean proteins, low-fat dairy, and heart-healthy fats.  Work with  your health care provider or diet and nutrition specialist (dietitian) to adjust your eating plan to your individual calorie needs. This information is not intended to replace advice given to you by your health care provider. Make sure you discuss any questions you have with your health care provider. Document Released: 01/08/2011 Document Revised: 01/13/2016 Document Reviewed: 01/13/2016 Elsevier Interactive Patient Education  2018 [employer].   IF you received an x-ray today, you will receive an invoice from [facility]. Please contact [facility] at [phone] with questions or concerns regarding your invoice.   IF you received labwork today, you will receive an invoice from [place]. Please contact [facility] at [phone] with questions or concerns regarding your invoice.   Our billing staff will not be able to assist you with questions regarding bills from these companies.  You will be contacted with the lab results as soon as they are available. The fastest way to get your results is to activate your My Chart account. Instructions are located on the last page of this paperwork. If you have not heard from [place] regarding the results in 2 weeks, please contact this office.

## 2017-10-05 NOTE — Progress Notes (Signed)
Ashlee Davis  MRN: [id number] DOB: 28-May-1965  PCP: Patient, No Pcp Per  Subjective:  Pt is a 52 year old female who presents to clinic for medication refill of losartan 50mg  and crestor 5 mg. She is a former pt of Dr. Tamala Davis.    HTN - blood pressure today is 128/82. Last OV for this problem was 04/12/2017 with Dr. Tamala Davis.  Checks home blood pressures - are wnl.  She is thinking about driving to see Dr. Tamala Davis for annual exam.  Does not eat meals out.  Never Smoker. H/o stent placement in Feb 2019.  Denies lightheadedness, dizziness, chronic headache, double vision, chest pain, shortness of breath, heart racing, palpitations, nausea, vomiting, abdominal pain, hematuria, lower leg swelling.   Results notes recorded by Ashlee Honour, MD on 08/01/2017: Cholesterol is under excellent control. Sugar/glucose is normal. Liver and kidney functions are normal. Continue current medications.  BP Readings from Last 3 Encounters:  10/05/17 128/82  04/12/17 122/72  10/13/16 138/88   Lab Results  Component Value Date   CHOL 142 04/12/2017   CHOL 161 10/13/2016   CHOL 178 04/08/2016   Lab Results  Component Value Date   HDL 55 04/12/2017   HDL 52 10/13/2016   HDL 58 04/08/2016   Lab Results  Component Value Date   LDLCALC 63 04/12/2017   LDLCALC 74 10/13/2016   LDLCALC 86 04/08/2016   Lab Results  Component Value Date   TRIG 121 04/12/2017   TRIG 174 (H) 10/13/2016   TRIG 169 (H) 04/08/2016   Lab Results  Component Value Date   CHOLHDL 2.6 04/12/2017   CHOLHDL 3.1 10/13/2016   CHOLHDL 3.1 04/08/2016   No results found for: LDLDIRECT  She denies flu shot.   Review of Systems  Constitutional: Negative for chills and fever.  Respiratory: Negative for cough, shortness of breath and wheezing.   Cardiovascular: Negative for chest pain, palpitations and leg swelling.  Gastrointestinal: Negative for nausea and vomiting.    Patient Active Problem List   Diagnosis Date  Noted  . Essential hypertension 04/08/2016  . Pure hypercholesterolemia 04/08/2016  . Family history of stroke 04/08/2016    Current Outpatient Medications on File Prior to Visit  Medication Sig Dispense Refill  . losartan (COZAAR) 50 MG tablet Take 1 tablet (50 mg total) by mouth daily. 90 tablet 1  . meloxicam (MOBIC) 7.5 MG tablet TAKE ONE TABLET BY MOUTH DAILY WITH FOOD 90 tablet 0  . rosuvastatin (CRESTOR) 5 MG tablet Take 1 tablet (5 mg total) by mouth daily. 90 tablet 1   Current Facility-Administered Medications on File Prior to Visit  Medication Dose Route Frequency Provider Last Rate Last Dose  . 0.9 %  sodium chloride infusion  500 mL Intravenous Continuous Ashlee Shipper, MD        Allergies  Allergen Reactions  . Wine [Alcohol]      Objective:  BP 128/82 (BP Location: Left Arm, Patient Position: Sitting, Cuff Size: Normal)   Pulse 68   Temp 98.1 F (36.7 C) (Oral)   Resp 16   Ht 5' (1.524 m)   Wt 142 lb 12.8 oz (64.8 kg)   SpO2 100%   BMI 27.89 kg/m   Physical Exam  Constitutional: She is oriented to person, place, and time. No distress.  Cardiovascular: Normal rate, regular rhythm and normal heart sounds.  Neurological: She is alert and oriented to person, place, and time.  Skin: Skin is warm and dry.  Psychiatric:  Judgment normal.  Vitals reviewed.   Assessment and Plan :  1. Essential hypertension - Former pt of Dr. Tamala Davis. Blood pressure controlled with Cozaar 50mg  qd. Blood pressure today is 128/82. She is asymptomatic. Last OV for this problem was 04/12/2017 with Dr. Tamala Davis.  RTC in 6 months for annual exam.  Checks home blood pressures - are wnl.   - losartan (COZAAR) 50 MG tablet; Take 1 tablet (50 mg total) by mouth daily.  Dispense: 90 tablet; Refill: 1  2. Hyperlipidemia, unspecified hyperlipidemia type - Lipids controlled. Recheck in 6 months at annual exam.  - rosuvastatin (CRESTOR) 5 MG tablet; Take 1 tablet (5 mg total) by mouth daily.   Dispense: 90 tablet; Refill: 1  Ashlee Deanza Upperman, PA-C  [facility] 10/05/2017 10:18 AM  Please note: Portions of this report may have been transcribed using dragon voice recognition software. Every effort was made to ensure accuracy; however, inadvertent computerized transcription errors may be present.

## 2017-10-06 ENCOUNTER — Other Ambulatory Visit: Payer: Self-pay | Admitting: Family Medicine

## 2017-10-25 ENCOUNTER — Encounter: Payer: BLUE CROSS/BLUE SHIELD | Admitting: Family Medicine

## 2017-12-20 DIAGNOSIS — K122 Cellulitis and abscess of mouth: Secondary | ICD-10-CM | POA: Diagnosis not present

## 2018-03-25 ENCOUNTER — Ambulatory Visit: Payer: BLUE CROSS/BLUE SHIELD | Admitting: Family Medicine

## 2018-04-02 ENCOUNTER — Telehealth: Payer: Self-pay | Admitting: Family Medicine

## 2018-04-02 ENCOUNTER — Ambulatory Visit (INDEPENDENT_AMBULATORY_CARE_PROVIDER_SITE_OTHER): Payer: BLUE CROSS/BLUE SHIELD | Admitting: Family Medicine

## 2018-04-02 DIAGNOSIS — E7800 Pure hypercholesterolemia, unspecified: Secondary | ICD-10-CM

## 2018-04-02 DIAGNOSIS — Z131 Encounter for screening for diabetes mellitus: Secondary | ICD-10-CM

## 2018-04-02 DIAGNOSIS — I10 Essential (primary) hypertension: Secondary | ICD-10-CM | POA: Diagnosis not present

## 2018-04-02 DIAGNOSIS — Z5181 Encounter for therapeutic drug level monitoring: Secondary | ICD-10-CM

## 2018-04-02 NOTE — Telephone Encounter (Signed)
Patient walked in for fasting labs

## 2018-04-04 ENCOUNTER — Ambulatory Visit (INDEPENDENT_AMBULATORY_CARE_PROVIDER_SITE_OTHER): Payer: BLUE CROSS/BLUE SHIELD | Admitting: Family Medicine

## 2018-04-04 ENCOUNTER — Encounter: Payer: Self-pay | Admitting: Family Medicine

## 2018-04-04 ENCOUNTER — Other Ambulatory Visit: Payer: Self-pay

## 2018-04-04 ENCOUNTER — Ambulatory Visit (INDEPENDENT_AMBULATORY_CARE_PROVIDER_SITE_OTHER): Payer: BLUE CROSS/BLUE SHIELD

## 2018-04-04 VITALS — BP 130/92 | HR 104 | Temp 98.0°F | Resp 16 | Ht 60.83 in | Wt 148.0 lb

## 2018-04-04 DIAGNOSIS — Z0001 Encounter for general adult medical examination with abnormal findings: Secondary | ICD-10-CM | POA: Diagnosis not present

## 2018-04-04 DIAGNOSIS — Z0000 Encounter for general adult medical examination without abnormal findings: Secondary | ICD-10-CM

## 2018-04-04 DIAGNOSIS — M79641 Pain in right hand: Secondary | ICD-10-CM

## 2018-04-04 DIAGNOSIS — R8290 Unspecified abnormal findings in urine: Secondary | ICD-10-CM

## 2018-04-04 DIAGNOSIS — I10 Essential (primary) hypertension: Secondary | ICD-10-CM

## 2018-04-04 DIAGNOSIS — E785 Hyperlipidemia, unspecified: Secondary | ICD-10-CM | POA: Diagnosis not present

## 2018-04-04 LAB — HEMOGLOBIN A1C
Est. average glucose Bld gHb Est-mCnc: 131 mg/dL
Hgb A1c MFr Bld: 6.2 % — ABNORMAL HIGH (ref 4.8–5.6)

## 2018-04-04 LAB — CMP14+EGFR
ALT: 25 IU/L (ref 0–32)
AST: 21 IU/L (ref 0–40)
Albumin/Globulin Ratio: 2 (ref 1.2–2.2)
Albumin: 4.9 g/dL (ref 3.8–4.9)
Alkaline Phosphatase: 77 IU/L (ref 39–117)
BUN/Creatinine Ratio: 18 (ref 9–23)
BUN: 13 mg/dL (ref 6–24)
Bilirubin Total: 0.8 mg/dL (ref 0.0–1.2)
CO2: 23 mmol/L (ref 20–29)
Calcium: 10 mg/dL (ref 8.7–10.2)
Chloride: 103 mmol/L (ref 96–106)
Creatinine, Ser: 0.71 mg/dL (ref 0.57–1.00)
GFR calc Af Amer: 114 mL/min/{1.73_m2} (ref >59)
GFR calc non Af Amer: 99 mL/min/{1.73_m2} (ref >59)
Globulin, Total: 2.5 g/dL (ref 1.5–4.5)
Glucose: 104 mg/dL — ABNORMAL HIGH (ref 65–99)
POTASSIUM: 4.7 mmol/L (ref 3.5–5.2)
SODIUM: 144 mmol/L (ref 134–144)
Total Protein: 7.4 g/dL (ref 6.0–8.5)

## 2018-04-04 LAB — LIPID PANEL
CHOL/HDL RATIO: 3 ratio (ref 0.0–4.4)
Cholesterol, Total: 170 mg/dL (ref 100–199)
HDL: 56 mg/dL (ref >39)
LDL CALC: 83 mg/dL (ref 0–99)
Triglycerides: 153 mg/dL — ABNORMAL HIGH (ref 0–149)
VLDL Cholesterol Cal: 31 mg/dL (ref 5–40)

## 2018-04-04 LAB — POCT URINALYSIS DIP (MANUAL ENTRY)
Bilirubin, UA: NEGATIVE
Glucose, UA: NEGATIVE mg/dL
Nitrite, UA: NEGATIVE
Protein Ur, POC: NEGATIVE mg/dL
SPEC GRAV UA: 1.025 (ref 1.010–1.025)
UROBILINOGEN UA: 1 U/dL
pH, UA: 6 (ref 5.0–8.0)

## 2018-04-04 LAB — TSH: TSH: 2.19 u[IU]/mL (ref 0.450–4.500)

## 2018-04-04 MED ORDER — LOSARTAN POTASSIUM-HCTZ 50-12.5 MG PO TABS: 0.5000 | ORAL_TABLET | Freq: Every day | ORAL | 0 refills | Status: DC

## 2018-04-04 MED ORDER — MELOXICAM 7.5 MG PO TABS: ORAL_TABLET | ORAL | 1 refills | Status: DC

## 2018-04-04 MED ORDER — ROSUVASTATIN CALCIUM 5 MG PO TABS: 5.0000 mg | ORAL_TABLET | Freq: Every day | ORAL | 1 refills | Status: DC

## 2018-04-04 NOTE — Progress Notes (Signed)
Chief Complaint  Patient presents with  . Establish Care    pt need a new PCP to manage chronic conditions   . Arthritis    pt states she is concerned about arthritis     Subjective:  Ashlee Davis is a 53 y.o. female here for a health maintenance visit.  Patient is established pt  Here for physical She has known history of arthritis needing meloxicam She still gets pain in her right pinky finger and right thumb  Patient Active Problem List   Diagnosis Date Noted  . Essential hypertension 04/08/2016  . Pure hypercholesterolemia 04/08/2016  . Family history of stroke 04/08/2016    Past Medical History:  Diagnosis Date  . Allergy   . Arthritis   . Hypertension   . Nevus   . Other and unspecified hyperlipidemia     Past Surgical History:  Procedure Laterality Date  . NONE       Outpatient Medications Prior to Visit  Medication Sig Dispense Refill  . losartan (COZAAR) 50 MG tablet Take 1 tablet (50 mg total) by mouth daily. 90 tablet 1  . meloxicam (MOBIC) 7.5 MG tablet TAKE ONE TABLET BY MOUTH DAILY WITH FOOD 90 tablet 1  . rosuvastatin (CRESTOR) 5 MG tablet Take 1 tablet (5 mg total) by mouth daily. 90 tablet 1   Facility-Administered Medications Prior to Visit  Medication Dose Route Frequency Provider Last Rate Last Dose  . 0.9 %  sodium chloride infusion  500 mL Intravenous Continuous Irene Shipper, MD        Allergies  Allergen Reactions  . Wine [Alcohol]      Family History  Problem Relation Age of Onset  . Hypertension Mother   . Heart disease Mother 54       AMI   . Hyperlipidemia Mother   . Hypertension Sister   . Hyperlipidemia Sister   . Stroke Sister   . Colon cancer Neg Hx   . Esophageal cancer Neg Hx   . Rectal cancer Neg Hx   . Stomach cancer Neg Hx   . Liver cancer Neg Hx      Health Habits: Dental Exam: up to date Eye Exam: up to date   Social History   Socioeconomic History  . Marital status: Married    Spouse name:  Ferninand  . Number of children: 0  . Years of education: 72  . Highest education level: Not on file  Occupational History  . Occupation: homemaker  Social Needs  . Financial resource strain: Not on file  . Food insecurity:    Worry: Not on file    Inability: Not on file  . Transportation needs:    Medical: Not on file    Non-medical: Not on file  Tobacco Use  . Smoking status: Never Smoker  . Smokeless tobacco: Never Used  Substance and Sexual Activity  . Alcohol use: Yes    Comment: wine occasionally  . Drug use: No  . Sexual activity: Yes    Partners: Male  Lifestyle  . Physical activity:    Days per week: Not on file    Minutes per session: Not on file  . Stress: Not on file  Relationships  . Social connections:    Talks on phone: More than three times a week    Gets together: Three times a week    Attends religious service: More than 4 times per year    Active member of club or organization: Yes  Attends meetings of clubs or organizations: More than 4 times per year    Relationship status: Married  . Intimate partner violence:    Fear of current or ex partner: No    Emotionally abused: No    Physically abused: No    Forced sexual activity: No  Other Topics Concern  . Not on file  Social History Narrative   Marital status: married since 1999.  Happily married; no abuse.      Children:  None      Lives: with husband; from [place]; moved to [place] to [place].   From the [place], left at age 65 for the [place].      Tobacco: never      Alcohol: socially but rare due to skin flushing.      Exercises:  Walking every morning; works in garden a lot.   Always uses seat belts. Smoke alarm in the home. No guns in the home.   Caffeine use: in moderation daily.   Social History   Substance and Sexual Activity  Alcohol Use Yes   Comment: wine occasionally   Social History   Tobacco Use  Smoking Status Never Smoker  Smokeless Tobacco Never Used   Social  History   Substance and Sexual Activity  Drug Use No    GYN: Sexual Health Menstrual status: regular menses LMP: No LMP recorded. Patient is premenopausal. Last pap smear: see HM section History of abnormal pap smears:  Sexually active: with female partner   Health Maintenance: See under health Maintenance activity for review of completion dates as well. Immunization History  Administered Date(s) Administered  . Tdap 07/01/2012      Depression Screen-PHQ2/9 Depression screen [facility] 2/9 04/04/2018 10/05/2017 04/12/2017 10/13/2016 06/24/2016  Decreased Interest 0 0 0 0 0  Down, Depressed, Hopeless 0 0 0 0 0  PHQ - 2 Score 0 0 0 0 0       Depression Severity and Treatment Recommendations:  0-4= None  5-9= Mild / Treatment: Support, educate to call if worse; return in one month  10-14= Moderate / Treatment: Support, watchful waiting; Antidepressant or Psycotherapy  15-19= Moderately severe / Treatment: Antidepressant OR Psychotherapy  >= 20 = Major depression, severe / Antidepressant AND Psychotherapy    Review of Systems   ROS  See HPI for ROS as well.  Review of Systems  Constitutional: Negative for activity change, appetite change, chills and fever.  HENT: Negative for congestion, nosebleeds, trouble swallowing and voice change.   Respiratory: Negative for cough, shortness of breath and wheezing.   Gastrointestinal: Negative for diarrhea, nausea and vomiting.  Genitourinary: Negative for difficulty urinating, dysuria, flank pain and hematuria.  Musculoskeletal: Negative for back pain and neck pain.  Neurological: Negative for dizziness, speech difficulty, light-headedness and numbness.  See HPI. All other review of systems negative.    Objective:   Vitals:   04/04/18 1432  BP: (!) 130/92  Pulse: (!) 104  Resp: 16  Temp: 98 F (36.7 C)  TempSrc: Oral  SpO2: 98%  Weight: 148 lb (67.1 kg)  Height: 5' 0.83" (1.545 m)    Body mass index is 28.12  kg/m.  Physical Exam  Physical Exam  Constitutional: Oriented to person, place, and time. Appears well-developed and well-nourished.  HENT:  Head: Normocephalic and atraumatic.  Eyes: Conjunctivae and EOM are normal.  Ears: TM clear, no effusion Cardiovascular: Normal rate, regular rhythm, normal heart sounds and intact distal pulses.  No murmur heard. Pulmonary/Chest: Effort normal and  breath sounds normal. No stridor. No respiratory distress. Has no wheezes.  Abdomen: non-distended, normoactive bs, soft, nontender Neurological: Is alert and oriented to person, place, and time.  Skin: Skin is warm. Capillary refill takes less than 2 seconds.  Psychiatric: Has a normal mood and affect. Behavior is normal. Judgment and thought content normal.     Assessment/Plan:   Patient was seen for a health maintenance exam.  Counseled the patient on health maintenance issues. Reviewed her health mainteance schedule and ordered appropriate tests (see orders.) Counseled on regular exercise and weight management. Recommend regular eye exams and dental cleaning.   The following issues were addressed today for health maintenance:   Falana was seen today for establish care and arthritis.  Diagnoses and all orders for this visit:  Encounter for health maintenance examination in adult- Women's Health Maintenance Plan Advised monthly breast exam and annual mammogram Advised dental exam every six months Discussed stress management Discussed pap smear screening guidelines    Abnormal urine odor- no infection -     POCT urinalysis dipstick  Essential hypertension-  Added hctz to losartan to help with edema  Hyperlipidemia, unspecified hyperlipidemia type  Pain of right hand-- arthritis noted Continue meloxicam -     DG Hand Complete Right; Future  Other orders -     rosuvastatin (CRESTOR) 5 MG tablet; Take 1 tablet (5 mg total) by mouth daily. -     meloxicam (MOBIC) 7.5 MG tablet; TAKE  ONE TABLET BY MOUTH DAILY WITH FOOD -     losartan-hydrochlorothiazide (HYZAAR) 50-12.5 MG tablet; Take 0.5 tablets by mouth daily.    Return in about 6 months (around 10/05/2018) for hypertension.    Body mass index is 28.12 kg/m.:  Discussed the patient's BMI with patient. The BMI body mass index is 28.12 kg/m.     Future Appointments  Date Time Provider Sebastian  10/05/2018 10:40 AM Forrest Moron, MD PCP-PCP [facility]    Patient Instructions    EXAM: RIGHT HAND - COMPLETE 3+ VIEW  COMPARISON:  None.  FINDINGS: No fracture or dislocation. No suspicious focal osseous lesions. Minimal osteoarthritis at the third MCP joint and fifth DIP joint. No radiopaque foreign body.  IMPRESSION: Minimal polyarticular osteoarthritis as detailed.   Electronically Signed   By: Ilona Sorrel M.D.   On: 04/04/2018 15:20    If you have lab work done today you will be contacted with your lab results within the next 2 weeks.  If you have not heard from [place] then please contact us. The fastest way to get your results is to register for My Chart.   IF you received an x-ray today, you will receive an invoice from [facility]. Please contact [facility] at [phone] with questions or concerns regarding your invoice.   IF you received labwork today, you will receive an invoice from [place]. Please contact [facility] at [phone] with questions or concerns regarding your invoice.   Our billing staff will not be able to assist you with questions regarding bills from these companies.  You will be contacted with the lab results as soon as they are available. The fastest way to get your results is to activate your My Chart account. Instructions are located on the last page of this paperwork. If you have not heard from [place] regarding the results in 2 weeks, please contact this office.

## 2018-04-04 NOTE — Patient Instructions (Addendum)
  EXAM: RIGHT HAND - COMPLETE 3+ VIEW  COMPARISON:  None.  FINDINGS: No fracture or dislocation. No suspicious focal osseous lesions. Minimal osteoarthritis at the third MCP joint and fifth DIP joint. No radiopaque foreign body.  IMPRESSION: Minimal polyarticular osteoarthritis as detailed.   Electronically Signed   By: Ilona Sorrel M.D.   On: 04/04/2018 15:20    If you have lab work done today you will be contacted with your lab results within the next 2 weeks.  If you have not heard from [place] then please contact us. The fastest way to get your results is to register for My Chart.   IF you received an x-ray today, you will receive an invoice from [facility]. Please contact [facility] at [phone] with questions or concerns regarding your invoice.   IF you received labwork today, you will receive an invoice from [place]. Please contact [facility] at [phone] with questions or concerns regarding your invoice.   Our billing staff will not be able to assist you with questions regarding bills from these companies.  You will be contacted with the lab results as soon as they are available. The fastest way to get your results is to activate your My Chart account. Instructions are located on the last page of this paperwork. If you have not heard from [place] regarding the results in 2 weeks, please contact this office.

## 2018-04-05 ENCOUNTER — Telehealth: Payer: Self-pay | Admitting: Family Medicine

## 2018-04-05 NOTE — Telephone Encounter (Signed)
Copied from [place] [phone]. Topic: Quick Communication - See Telephone Encounter >> Apr 05, 2018 10:37 AM Rutherford Nail, NT wrote: CRM for notification. See Telephone encounter for: 04/05/18. Pharmacy calling and states that the patient is requesting hydrochlorothiazide 12.5MG . States that she still has the  losartan 50MG  at home and would like to use those medications up before getting the new combination medication that was sent in. Please advise. [facility] [address] [place]

## 2018-04-11 NOTE — Telephone Encounter (Signed)
Spoke with pt about medication refill and she states she does not need any more refills at this time, she states she will take her Losartan until she runs out and then start taking the combination tablet for her B/P. I advised her that Dr. Kaleen Mask recommends she starts the combination tablet but she states she does not want to wastes her money. I verbalized understanding and will advise Dr. Kaleen Mask.

## 2018-10-05 ENCOUNTER — Ambulatory Visit: Payer: BLUE CROSS/BLUE SHIELD | Admitting: Family Medicine

## 2018-10-16 NOTE — Progress Notes (Signed)
Established Patient Office Visit  Subjective:  Patient ID: Ashlee Davis, female    DOB: 06-13-1965  Age: 53 y.o. MRN: [id number]  CC:  Chief Complaint  Patient presents with  . Hypertension    6 month f/u    HPI Ashlee Davis presents for  Hypertension: Patient here for follow-up of elevated blood pressure. She is exercising and is adherent to low salt diet.  Blood pressure is well controlled at home. Cardiac symptoms none.  Cardiovascular risk factors: hypertension.   She is taking NSAIDs prn She is taking losartan or losartan-hctz 0.5 tablet She denies any chest pain, sob, dizziness, muscle cramps   Prediabetes She is exercising She denies a family history of diabetes Wt Readings from Last 3 Encounters:  10/17/18 149 lb (67.6 kg)  04/04/18 148 lb (67.1 kg)  10/05/17 142 lb 12.8 oz (64.8 kg)   She exercises to lose weight She denies polyuria, polydipsia, polyuria She thinks she is urinating more due to hctz She is not fasting currently. Lab Results  Component Value Date   HGBA1C 6.2 (H) 04/02/2018    Past Medical History:  Diagnosis Date  . Allergy   . Arthritis   . Hypertension   . Nevus   . Other and unspecified hyperlipidemia     Past Surgical History:  Procedure Laterality Date  . NONE      Family History  Problem Relation Age of Onset  . Hypertension Mother   . Heart disease Mother 81       AMI   . Hyperlipidemia Mother   . Hypertension Sister   . Hyperlipidemia Sister   . Stroke Sister   . Colon cancer Neg Hx   . Esophageal cancer Neg Hx   . Rectal cancer Neg Hx   . Stomach cancer Neg Hx   . Liver cancer Neg Hx     Social History   Socioeconomic History  . Marital status: Married    Spouse name: Ferninand  . Number of children: 0  . Years of education: 55  . Highest education level: Not on file  Occupational History  . Occupation: homemaker  Social Needs  . Financial resource strain: Not on file  . Food insecurity   Worry: Not on file    Inability: Not on file  . Transportation needs    Medical: Not on file    Non-medical: Not on file  Tobacco Use  . Smoking status: Never Smoker  . Smokeless tobacco: Never Used  Substance and Sexual Activity  . Alcohol use: Yes    Comment: wine occasionally  . Drug use: No  . Sexual activity: Yes    Partners: Male  Lifestyle  . Physical activity    Days per week: Not on file    Minutes per session: Not on file  . Stress: Not on file  Relationships  . Social connections    Talks on phone: More than three times a week    Gets together: Three times a week    Attends religious service: More than 4 times per year    Active member of club or organization: Yes    Attends meetings of clubs or organizations: More than 4 times per year    Relationship status: Married  . Intimate partner violence    Fear of current or ex partner: No    Emotionally abused: No    Physically abused: No    Forced sexual activity: No  Other Topics Concern  . Not on  file  Social History Narrative   Marital status: married since 1999.  Happily married; no abuse.      Children:  None      Lives: with husband; from [place]; moved to [place] to [place].   From the [place], left at age 89 for the [place].      Tobacco: never      Alcohol: socially but rare due to skin flushing.      Exercises:  Walking every morning; works in garden a lot.   Always uses seat belts. Smoke alarm in the home. No guns in the home.   Caffeine use: in moderation daily.    Outpatient Medications Prior to Visit  Medication Sig Dispense Refill  . losartan-hydrochlorothiazide (HYZAAR) 50-12.5 MG tablet Take 0.5 tablets by mouth daily. 90 tablet 0  . meloxicam (MOBIC) 7.5 MG tablet TAKE ONE TABLET BY MOUTH DAILY WITH FOOD 90 tablet 1  . rosuvastatin (CRESTOR) 5 MG tablet Take 1 tablet (5 mg total) by mouth daily. 90 tablet 1  . losartan (COZAAR) 50 MG tablet Take 1 tablet (50 mg total) by mouth daily. 90  tablet 1   Facility-Administered Medications Prior to Visit  Medication Dose Route Frequency Provider Last Rate Last Dose  . 0.9 %  sodium chloride infusion  500 mL Intravenous Continuous Irene Shipper, MD        Allergies  Allergen Reactions  . Wine [Alcohol]     ROS Review of Systems Review of Systems  Constitutional: Negative for activity change, appetite change, chills and fever.  HENT: Negative for congestion, nosebleeds, trouble swallowing and voice change.   Respiratory: Negative for cough, shortness of breath and wheezing.   Gastrointestinal: Negative for diarrhea, nausea and vomiting.  Genitourinary: Negative for difficulty urinating, dysuria, flank pain and hematuria.  Musculoskeletal: Negative for back pain, joint swelling and neck pain.  Neurological: Negative for dizziness, speech difficulty, light-headedness and numbness.  See HPI. All other review of systems negative.     Objective:    Physical Exam  BP 114/86 (BP Location: Right Arm, Patient Position: Sitting, Cuff Size: Normal)   Pulse (!) 113   Temp 98.4 F (36.9 C) (Oral)   Resp 17   Ht 5' 0.83" (1.545 m)   Wt 149 lb (67.6 kg)   SpO2 98%   BMI 28.31 kg/m  Wt Readings from Last 3 Encounters:  10/17/18 149 lb (67.6 kg)  04/04/18 148 lb (67.1 kg)  10/05/17 142 lb 12.8 oz (64.8 kg)   Physical Exam  Constitutional: Oriented to person, place, and time. Appears well-developed and well-nourished.  HENT:  Head: Normocephalic and atraumatic.  Eyes: Conjunctivae and EOM are normal.  Cardiovascular: Normal rate, regular rhythm, normal heart sounds and intact distal pulses.  No murmur heard. Pulmonary/Chest: Effort normal and breath sounds normal. No stridor. No respiratory distress. Has no wheezes.  Neurological: Is alert and oriented to person, place, and time.  Skin: Skin is warm. Capillary refill takes less than 2 seconds.  Psychiatric: Has a normal mood and affect. Behavior is normal. Judgment and  thought content normal.    Health Maintenance Due  Topic Date Due  . MAMMOGRAM  08/29/2016  . INFLUENZA VACCINE  09/03/2018  . PAP SMEAR-Modifier  10/29/2018    There are no preventive care reminders to display for this patient.  Lab Results  Component Value Date   TSH 2.190 04/02/2018   Lab Results  Component Value Date   WBC 7.2 04/12/2017   HGB  14.3 04/12/2017   HCT 41.8 04/12/2017   MCV 95 04/12/2017   PLT 225 04/12/2017   Lab Results  Component Value Date   NA 144 04/02/2018   K 4.7 04/02/2018   CO2 23 04/02/2018   GLUCOSE 104 (H) 04/02/2018   BUN 13 04/02/2018   CREATININE 0.71 04/02/2018   BILITOT 0.8 04/02/2018   ALKPHOS 77 04/02/2018   AST 21 04/02/2018   ALT 25 04/02/2018   PROT 7.4 04/02/2018   ALBUMIN 4.9 04/02/2018   CALCIUM 10.0 04/02/2018   Lab Results  Component Value Date   CHOL 170 04/02/2018   Lab Results  Component Value Date   HDL 56 04/02/2018   Lab Results  Component Value Date   LDLCALC 83 04/02/2018   Lab Results  Component Value Date   TRIG 153 (H) 04/02/2018   Lab Results  Component Value Date   CHOLHDL 3.0 04/02/2018   Lab Results  Component Value Date   HGBA1C 6.2 (H) 04/02/2018      Assessment & Plan:   Problem List Items Addressed This Visit      Cardiovascular and Mediastinum   Essential hypertension - Primary Patient's blood pressure is at goal of 139/89 or less. Condition is stable. Continue current medications and treatment plan. I recommend that you exercise for 30-45 minutes 5 days a week. I also recommend a balanced diet with fruits and vegetables every day, lean meats, and little fried foods. The DASH diet (you can find this online) is a good example of this.    Relevant Orders   CMP14+EGFR   Lipid panel    Other Visit Diagnoses    Prediabetes    - patient to continue to monitor   Relevant Orders   Hemoglobin A1c   Abnormal urinalysis    - will recheck urine today, last UA was positive for blood    Relevant Orders   POCT Microscopic Urinalysis ([facility])   POCT urinalysis dipstick      A total of 25 minutes were spent face-to-face with the patient during this encounter and over half of that time was spent on counseling and coordination of care.  No orders of the defined types were placed in this encounter.   Follow-up: No follow-ups on file.    Forrest Moron, MD

## 2018-10-17 ENCOUNTER — Ambulatory Visit (INDEPENDENT_AMBULATORY_CARE_PROVIDER_SITE_OTHER): Payer: BC Managed Care – PPO | Admitting: Family Medicine

## 2018-10-17 ENCOUNTER — Other Ambulatory Visit: Payer: Self-pay

## 2018-10-17 ENCOUNTER — Encounter: Payer: Self-pay | Admitting: Family Medicine

## 2018-10-17 VITALS — BP 114/86 | HR 113 | Temp 98.4°F | Resp 17 | Ht 60.83 in | Wt 149.0 lb

## 2018-10-17 DIAGNOSIS — R8290 Unspecified abnormal findings in urine: Secondary | ICD-10-CM | POA: Diagnosis not present

## 2018-10-17 DIAGNOSIS — I10 Essential (primary) hypertension: Secondary | ICD-10-CM

## 2018-10-17 DIAGNOSIS — R7303 Prediabetes: Secondary | ICD-10-CM

## 2018-10-17 LAB — POCT URINALYSIS DIP (MANUAL ENTRY)
Bilirubin, UA: NEGATIVE
Glucose, UA: NEGATIVE mg/dL
Leukocytes, UA: NEGATIVE
Nitrite, UA: NEGATIVE
Protein Ur, POC: NEGATIVE mg/dL
Spec Grav, UA: >=1.03 — AB (ref 1.010–1.025)
Urobilinogen, UA: 0.2 E.U./dL
pH, UA: 5.5 (ref 5.0–8.0)

## 2018-10-17 LAB — POC MICROSCOPIC URINALYSIS (UMFC): Mucus: ABSENT

## 2018-10-17 MED ORDER — MELOXICAM 7.5 MG PO TABS: ORAL_TABLET | ORAL | 1 refills | Status: DC

## 2018-10-17 MED ORDER — LOSARTAN POTASSIUM-HCTZ 50-12.5 MG PO TABS: 0.5000 | ORAL_TABLET | Freq: Every day | ORAL | 1 refills | Status: DC

## 2018-10-17 MED ORDER — ROSUVASTATIN CALCIUM 5 MG PO TABS: 5.0000 mg | ORAL_TABLET | Freq: Every day | ORAL | 1 refills | Status: DC

## 2018-10-17 NOTE — Patient Instructions (Signed)
° ° ° °  If you have lab work done today you will be contacted with your lab results within the next 2 weeks.  If you have not heard from us then please contact us. The fastest way to get your results is to register for My Chart. ° ° °IF you received an x-ray today, you will receive an invoice from [facility]. Please contact [facility] at [phone] with questions or concerns regarding your invoice.  ° °IF you received labwork today, you will receive an invoice from [facility]. Please contact [facility] at [phone] with questions or concerns regarding your invoice.  ° °Our billing staff will not be able to assist you with questions regarding bills from these companies. ° °You will be contacted with the lab results as soon as they are available. The fastest way to get your results is to activate your My Chart account. Instructions are located on the last page of this paperwork. If you have not heard from us regarding the results in 2 weeks, please contact this office. °  ° ° ° °

## 2018-10-18 ENCOUNTER — Telehealth: Payer: Self-pay | Admitting: Internal Medicine

## 2018-10-18 LAB — CMP14+EGFR
ALT: 23 IU/L (ref 0–32)
AST: 21 IU/L (ref 0–40)
Albumin/Globulin Ratio: 1.9 (ref 1.2–2.2)
Albumin: 4.8 g/dL (ref 3.8–4.9)
Alkaline Phosphatase: 87 IU/L (ref 39–117)
BUN/Creatinine Ratio: 25 — ABNORMAL HIGH (ref 9–23)
BUN: 21 mg/dL (ref 6–24)
Bilirubin Total: 0.6 mg/dL (ref 0.0–1.2)
CO2: 19 mmol/L — ABNORMAL LOW (ref 20–29)
Calcium: 9.8 mg/dL (ref 8.7–10.2)
Chloride: 107 mmol/L — ABNORMAL HIGH (ref 96–106)
Creatinine, Ser: 0.84 mg/dL (ref 0.57–1.00)
GFR calc Af Amer: 92 mL/min/{1.73_m2} (ref >59)
GFR calc non Af Amer: 80 mL/min/{1.73_m2} (ref >59)
Globulin, Total: 2.5 g/dL (ref 1.5–4.5)
Glucose: 134 mg/dL — ABNORMAL HIGH (ref 65–99)
Potassium: 3.8 mmol/L (ref 3.5–5.2)
Sodium: 144 mmol/L (ref 134–144)
Total Protein: 7.3 g/dL (ref 6.0–8.5)

## 2018-10-18 LAB — LIPID PANEL
Chol/HDL Ratio: 3.2 ratio (ref 0.0–4.4)
Cholesterol, Total: 156 mg/dL (ref 100–199)
HDL: 49 mg/dL (ref >39)
LDL Chol Calc (NIH): 51 mg/dL (ref 0–99)
Triglycerides: 375 mg/dL — ABNORMAL HIGH (ref 0–149)
VLDL Cholesterol Cal: 56 mg/dL — ABNORMAL HIGH (ref 5–40)

## 2018-10-18 LAB — HEMOGLOBIN A1C
Est. average glucose Bld gHb Est-mCnc: 128 mg/dL
Hgb A1c MFr Bld: 6.1 % — ABNORMAL HIGH (ref 4.8–5.6)

## 2018-10-18 NOTE — Telephone Encounter (Signed)
Fine to establish, no more than 1 new per day.  

## 2018-10-18 NOTE — Telephone Encounter (Signed)
Copied from [place] [id number]. Topic: Appointment Scheduling - New Patient >> Oct 18, 2018  9:56 AM Oneta Rack wrote: Patient would like to establish care with Dr. Sharlet Salina, informed patient PCP is not accepting NPA at this time. Patient states when Dr. Sharlet Salina decides to take [place] she would like a call

## 2018-10-21 NOTE — Telephone Encounter (Signed)
Scheduled

## 2018-10-21 NOTE — Telephone Encounter (Signed)
Pt calling back to schedule an appointment. Unable to schedule a np because session limit is 0 in epic. Please call back to schedule.   CB#[phone]

## 2018-10-21 NOTE — Telephone Encounter (Signed)
Patient will call back to schedule ?

## 2018-12-16 ENCOUNTER — Ambulatory Visit: Payer: BLUE CROSS/BLUE SHIELD | Admitting: Internal Medicine

## 2018-12-23 ENCOUNTER — Other Ambulatory Visit: Payer: Self-pay

## 2018-12-23 ENCOUNTER — Ambulatory Visit (INDEPENDENT_AMBULATORY_CARE_PROVIDER_SITE_OTHER): Payer: BC Managed Care – PPO | Admitting: Internal Medicine

## 2018-12-23 ENCOUNTER — Encounter: Payer: Self-pay | Admitting: Internal Medicine

## 2018-12-23 VITALS — BP 138/100 | HR 105 | Temp 98.7°F | Ht 60.83 in | Wt 147.0 lb

## 2018-12-23 DIAGNOSIS — M1990 Unspecified osteoarthritis, unspecified site: Secondary | ICD-10-CM | POA: Diagnosis not present

## 2018-12-23 DIAGNOSIS — I10 Essential (primary) hypertension: Secondary | ICD-10-CM

## 2018-12-23 DIAGNOSIS — E7800 Pure hypercholesterolemia, unspecified: Secondary | ICD-10-CM

## 2018-12-23 NOTE — Assessment & Plan Note (Signed)
Takes otc as needed. Does not need further workup at this time. We talked about turmeric to help.

## 2018-12-23 NOTE — Assessment & Plan Note (Signed)
BP at goal at home on losartan/hctz. Recent CMP without indication for change.

## 2018-12-23 NOTE — Assessment & Plan Note (Signed)
Taking crestor 5 mg daily. Recent lipid panel at goal.

## 2018-12-23 NOTE — Progress Notes (Signed)
   Subjective:   Patient ID: Ashlee Davis, female    DOB: [birth year], 53 y.o.   MRN: [id number]  HPI The patient is a new 53 YO female coming in for continuation of care for her blood pressure (taking losartan/hctz daily, BP normal at home running 110-120/70s, denies headaches or chest pains, denies side effects), and her arthritis (in the left hand more than right, some minor sticking of the finger but manageable) and hyperlipidemia (taking crestor, recent lipid panel at goal, denies side effects, denies stroke or chest pain symptoms).   PMH, [facility], social history reviewed and updated  Review of Systems  Constitutional: Negative.   HENT: Negative.   Eyes: Negative.   Respiratory: Negative for cough, chest tightness and shortness of breath.   Cardiovascular: Negative for chest pain, palpitations and leg swelling.  Gastrointestinal: Negative for abdominal distention, abdominal pain, constipation, diarrhea, nausea and vomiting.  Musculoskeletal: Positive for arthralgias.  Skin: Negative.   Neurological: Negative.   Psychiatric/Behavioral: Negative.     Objective:  Physical Exam Constitutional:      Appearance: She is well-developed.  HENT:     Head: Normocephalic and atraumatic.  Neck:     Musculoskeletal: Normal range of motion.  Cardiovascular:     Rate and Rhythm: Normal rate and regular rhythm.  Pulmonary:     Effort: Pulmonary effort is normal. No respiratory distress.     Breath sounds: Normal breath sounds. No wheezing or rales.  Abdominal:     General: Bowel sounds are normal. There is no distension.     Palpations: Abdomen is soft.     Tenderness: There is no abdominal tenderness. There is no rebound.  Skin:    General: Skin is warm and dry.  Neurological:     Mental Status: She is alert and oriented to person, place, and time.     Coordination: Coordination normal.     Vitals:   12/23/18 1321  BP: (!) 138/100  Pulse: (!) 105  Temp: 98.7 F (37.1 C)   TempSrc: Oral  SpO2: 98%  Weight: 147 lb (66.7 kg)  Height: 5' 0.83" (1.545 m)    This visit occurred during the SARS-CoV-2 public health emergency.  Safety protocols were in place, including screening questions prior to the visit, additional usage of staff PPE, and extensive cleaning of exam room while observing appropriate contact time as indicated for disinfecting solutions.   Assessment & Plan:

## 2018-12-23 NOTE — Patient Instructions (Signed)
We will see you back in about 6 months or so.   Let us know if you have any problems sooner.

## 2019-05-26 ENCOUNTER — Other Ambulatory Visit: Payer: Self-pay

## 2019-05-26 ENCOUNTER — Ambulatory Visit (INDEPENDENT_AMBULATORY_CARE_PROVIDER_SITE_OTHER): Payer: BC Managed Care – PPO | Admitting: Internal Medicine

## 2019-05-26 ENCOUNTER — Encounter: Payer: Self-pay | Admitting: Internal Medicine

## 2019-05-26 DIAGNOSIS — J309 Allergic rhinitis, unspecified: Secondary | ICD-10-CM | POA: Insufficient documentation

## 2019-05-26 DIAGNOSIS — J301 Allergic rhinitis due to pollen: Secondary | ICD-10-CM | POA: Diagnosis not present

## 2019-05-26 MED ORDER — ROSUVASTATIN CALCIUM 5 MG PO TABS: 5.0000 mg | ORAL_TABLET | Freq: Every day | ORAL | 3 refills | Status: DC

## 2019-05-26 MED ORDER — LOSARTAN POTASSIUM-HCTZ 50-12.5 MG PO TABS: 0.5000 | ORAL_TABLET | Freq: Every day | ORAL | 3 refills | Status: DC

## 2019-05-26 MED ORDER — MELOXICAM 7.5 MG PO TABS: ORAL_TABLET | ORAL | 3 refills | Status: DC

## 2019-05-26 MED ORDER — FLUTICASONE PROPIONATE 50 MCG/ACT NA SUSP
2.0000 | Freq: Every day | NASAL | 6 refills | Status: DC
Start: 2019-05-26 — End: 2020-06-17

## 2019-05-26 NOTE — Assessment & Plan Note (Signed)
Rx flonase and can stop zyrtec if desired. No signs of infection on exam.

## 2019-05-26 NOTE — Patient Instructions (Addendum)
We have sent in flonase which is a nose spray. Use 2 sprays in each nostril once a day.

## 2019-05-26 NOTE — Progress Notes (Signed)
   Subjective:   Patient ID: Ashlee Davis, female    DOB: 26-Jan-1966, 54 y.o.   MRN: [id number]  HPI The patient is a 54 YO female coming in for concerns about temple swelling. Started along with allergy symptoms within the last week. She is having mild ear pressure. She is taking zyrtec at night last 2-3 nights and noticed some improvement but not tons. She is not having any jaw or tooth problems. Denies temple pain. Overall stable since she noticed it.   Review of Systems  Constitutional: Positive for activity change. Negative for appetite change, chills, fatigue, fever and unexpected weight change.  HENT: Positive for congestion, postnasal drip and rhinorrhea. Negative for ear discharge, ear pain, sinus pressure, sinus pain, sneezing, sore throat, tinnitus, trouble swallowing and voice change.   Eyes: Negative.   Respiratory: Negative for cough, chest tightness, shortness of breath and wheezing.   Cardiovascular: Negative.   Gastrointestinal: Negative.   Musculoskeletal: Negative for myalgias.  Neurological: Negative.     Objective:  Physical Exam Constitutional:      Appearance: She is well-developed.  HENT:     Head: Normocephalic and atraumatic.     Comments: Minimal swelling temporal region without redness or tenderness to palpation, TMs bulging clear fluid bilateral and oropharynx with redness and clear drainage Cardiovascular:     Rate and Rhythm: Normal rate and regular rhythm.  Pulmonary:     Effort: Pulmonary effort is normal. No respiratory distress.     Breath sounds: Normal breath sounds. No wheezing or rales.  Abdominal:     General: Bowel sounds are normal. There is no distension.     Palpations: Abdomen is soft.     Tenderness: There is no abdominal tenderness. There is no rebound.  Musculoskeletal:     Cervical back: Normal range of motion.  Skin:    General: Skin is warm and dry.  Neurological:     Mental Status: She is alert and oriented to person, place,  and time.     Coordination: Coordination normal.     Vitals:   05/26/19 1353  BP: (!) 144/86  Pulse: 84  Temp: 97.7 F (36.5 C)  SpO2: 99%  Weight: 154 lb (69.9 kg)  Height: 5' (1.524 m)    This visit occurred during the SARS-CoV-2 public health emergency.  Safety protocols were in place, including screening questions prior to the visit, additional usage of staff PPE, and extensive cleaning of exam room while observing appropriate contact time as indicated for disinfecting solutions.   Assessment & Plan:

## 2019-06-22 ENCOUNTER — Ambulatory Visit: Payer: BC Managed Care – PPO | Admitting: Internal Medicine

## 2019-12-13 ENCOUNTER — Encounter: Payer: Self-pay | Admitting: Internal Medicine

## 2019-12-13 ENCOUNTER — Other Ambulatory Visit: Payer: Self-pay

## 2019-12-13 ENCOUNTER — Ambulatory Visit (INDEPENDENT_AMBULATORY_CARE_PROVIDER_SITE_OTHER): Payer: BC Managed Care – PPO | Admitting: Internal Medicine

## 2019-12-13 VITALS — BP 130/80 | HR 96 | Temp 98.4°F | Ht 60.0 in | Wt 146.0 lb

## 2019-12-13 DIAGNOSIS — R300 Dysuria: Secondary | ICD-10-CM

## 2019-12-13 DIAGNOSIS — R109 Unspecified abdominal pain: Secondary | ICD-10-CM | POA: Diagnosis not present

## 2019-12-13 LAB — POC URINALSYSI DIPSTICK (AUTOMATED)
Bilirubin, UA: NEGATIVE
Glucose, UA: NEGATIVE
Ketones, UA: NEGATIVE
Leukocytes, UA: NEGATIVE
Nitrite, UA: NEGATIVE
Protein, UA: NEGATIVE
Spec Grav, UA: 1.015 (ref 1.010–1.025)
Urobilinogen, UA: 0.2 E.U./dL
pH, UA: 6 (ref 5.0–8.0)

## 2019-12-13 NOTE — Patient Instructions (Addendum)
A ct scan was ordered to rule out a kidney stone.   Your preliminary urine does not look like there is an infection - there was a little blood.   We will send this for a culture.     Continue to drink lots of water.     If your symptoms return please call us.

## 2019-12-13 NOTE — Progress Notes (Signed)
Subjective:    Patient ID: Ashlee Davis, female    DOB: [birth year], 54 y.o.   MRN: [id number]  HPI The patient is here for an acute visit.   3 am - she urinated and it was normal 6 30 - went to urinate  - she had difficulty urinating and only went a little.  It was painful and had some pain in right flank and made her feel weak and like she was melting.  She drank a lot of fluids and took her meds.  She drank so much she vomited.  She denies nausea.    Since 10 - urinating normally - with increased frequency.  She has been drinking a lot.   She is worried about a uti - never had one.  She denies kidney stones.   Medications and allergies reviewed with patient and updated if appropriate.  Patient Active Problem List   Diagnosis Date Noted  . Allergic rhinitis 05/26/2019  . Arthritis 12/23/2018  . Essential hypertension 04/08/2016  . Pure hypercholesterolemia 04/08/2016  . Family history of stroke 04/08/2016    Current Outpatient Medications on File Prior to Visit  Medication Sig Dispense Refill  . losartan-hydrochlorothiazide (HYZAAR) 50-12.5 MG tablet Take 0.5 tablets by mouth daily. 90 tablet 3  . rosuvastatin (CRESTOR) 5 MG tablet Take 1 tablet (5 mg total) by mouth daily. 90 tablet 3  . fluticasone (FLONASE) 50 MCG/ACT nasal spray Place 2 sprays into both nostrils daily. (Patient not taking: Reported on 12/13/2019) 16 g 6  . meloxicam (MOBIC) 7.5 MG tablet TAKE ONE TABLET BY MOUTH DAILY WITH FOOD (Patient not taking: Reported on 12/13/2019) 90 tablet 3   No current facility-administered medications on file prior to visit.    Past Medical History:  Diagnosis Date  . Allergy   . Arthritis   . Hypertension   . Nevus   . Other and unspecified hyperlipidemia     Past Surgical History:  Procedure Laterality Date  . NONE      Social History   Socioeconomic History  . Marital status: Married    Spouse name: Ferninand  . Number of children: 0  . Years of  education: 46  . Highest education level: Not on file  Occupational History  . Occupation: homemaker  Tobacco Use  . Smoking status: Never Smoker  . Smokeless tobacco: Never Used  Substance and Sexual Activity  . Alcohol use: Yes    Comment: wine occasionally  . Drug use: No  . Sexual activity: Yes    Partners: Male  Other Topics Concern  . Not on file  Social History Narrative   Marital status: married since 1999.  Happily married; no abuse.      Children:  None      Lives: with husband; from [place]; moved to [place] to [place].   From the [place], left at age 69 for the [place].      Tobacco: never      Alcohol: socially but rare due to skin flushing.      Exercises:  Walking every morning; works in garden a lot.   Always uses seat belts. Smoke alarm in the home. No guns in the home.   Caffeine use: in moderation daily.   Social Determinants of Health   Financial Resource Strain:   . Difficulty of Paying Living Expenses: Not on file  Food Insecurity:   . Worried About Charity fundraiser in the Last Year: Not on file  . Ran  Out of Food in the Last Year: Not on file  Transportation Needs:   . Lack of Transportation (Medical): Not on file  . Lack of Transportation (Non-Medical): Not on file  Physical Activity:   . Days of Exercise per Week: Not on file  . Minutes of Exercise per Session: Not on file  Stress:   . Feeling of Stress : Not on file  Social Connections:   . Frequency of Communication with Friends and Family: Not on file  . Frequency of Social Gatherings with Friends and Family: Not on file  . Attends Religious Services: Not on file  . Active Member of Clubs or Organizations: Not on file  . Attends Archivist Meetings: Not on file  . Marital Status: Not on file    Family History  Problem Relation Age of Onset  . Hypertension Mother   . Heart disease Mother 47       AMI   . Hyperlipidemia Mother   . Hypertension Sister   . Hyperlipidemia  Sister   . Stroke Sister   . Colon cancer Neg Hx   . Esophageal cancer Neg Hx   . Rectal cancer Neg Hx   . Stomach cancer Neg Hx   . Liver cancer Neg Hx     Review of Systems  Constitutional: Negative for fever.  Gastrointestinal: Positive for vomiting (? drinking too much - cranberry, prune juice). Negative for abdominal pain, blood in stool, constipation, diarrhea and nausea.  Genitourinary: Positive for difficulty urinating, dysuria (once ), flank pain (right ) and frequency. Negative for hematuria.       Urine looks clear with some bubbles  Neurological: Positive for light-headedness.       Objective:   Vitals:   12/13/19 1418  BP: 130/80  Pulse: 96  Temp: 98.4 F (36.9 C)  SpO2: 99%   BP Readings from Last 3 Encounters:  12/13/19 130/80  05/26/19 (!) 144/86  12/23/18 (!) 138/100   Wt Readings from Last 3 Encounters:  12/13/19 146 lb (66.2 kg)  05/26/19 154 lb (69.9 kg)  12/23/18 147 lb (66.7 kg)   Body mass index is 28.51 kg/m.   Physical Exam Constitutional:      General: She is not in acute distress.    Appearance: Normal appearance. She is not ill-appearing.  HENT:     Head: Normocephalic and atraumatic.  Abdominal:     General: Abdomen is flat. There is no distension.     Palpations: Abdomen is soft.     Tenderness: There is no abdominal tenderness. There is no right CVA tenderness, left CVA tenderness, guarding or rebound.  Skin:    General: Skin is warm and dry.  Neurological:     Mental Status: She is alert.        Contains abnormal dataPOCT Urinalysis Dipstick (Automated) Order: [id number] Status:  Final result  Visible to patient:  No (inaccessible in MyChart)  Next appt:  12/14/2019 at 03:15 PM in Radiology ([facility]-CT 1)  Dx:  Dysuria  0 Result Notes  Ref Range & Units 14:37  Color, UA  yellow   Clarity, UA  clear   Glucose, UA Negative Negative   Bilirubin, UA  negative   Ketones, UA  negative   Spec Grav, UA 1.010 - 1.025 1.015     Blood, UA  1+   pH, UA 5.0 - 8.0 6.0   Protein, UA Negative Negative   Urobilinogen, UA 0.2 or 1.0 E.U./dL 0.2   Nitrite,  UA  negative   Leukocytes, UA Negative Negative       Specimen Collected: 12/13/19 14:37 Last Resulted: 12/13/19 14:37           Assessment & Plan:    See Problem List for Assessment and Plan of chronic medical problems.    This visit occurred during the SARS-CoV-2 public health emergency.  Safety protocols were in place, including screening questions prior to the visit, additional usage of staff PPE, and extensive cleaning of exam room while observing appropriate contact time as indicated for disinfecting solutions.

## 2019-12-13 NOTE — Assessment & Plan Note (Signed)
Acute One episode early this morning associated with difficulty urinating and flank pain Urine dip here with blood, no evidence of infection Currently symptoms free ? Stone, UTI less likely  Send urine for culture  Ct renal for stones

## 2019-12-13 NOTE — Assessment & Plan Note (Signed)
Acute r flank pain this morning associated with decreased urination, dysuria ? UTI vs stone Urine dip with blood only - send for culture Ct renal ordered  No meds at this time

## 2019-12-14 ENCOUNTER — Ambulatory Visit (INDEPENDENT_AMBULATORY_CARE_PROVIDER_SITE_OTHER)
Admission: RE | Admit: 2019-12-14 | Discharge: 2019-12-14 | Disposition: A | Discharge status: Home / Self Care | Payer: BC Managed Care – PPO | Source: Ambulatory Visit | Attending: Internal Medicine | Admitting: Internal Medicine

## 2019-12-14 DIAGNOSIS — N838 Other noninflammatory disorders of ovary, fallopian tube and broad ligament: Secondary | ICD-10-CM | POA: Diagnosis not present

## 2019-12-14 DIAGNOSIS — R300 Dysuria: Secondary | ICD-10-CM | POA: Diagnosis not present

## 2019-12-14 DIAGNOSIS — R109 Unspecified abdominal pain: Secondary | ICD-10-CM | POA: Diagnosis not present

## 2019-12-14 DIAGNOSIS — K7689 Other specified diseases of liver: Secondary | ICD-10-CM | POA: Diagnosis not present

## 2019-12-14 LAB — URINE CULTURE

## 2019-12-15 ENCOUNTER — Telehealth: Payer: Self-pay

## 2019-12-15 ENCOUNTER — Telehealth: Payer: Self-pay | Admitting: Internal Medicine

## 2019-12-15 DIAGNOSIS — D270 Benign neoplasm of right ovary: Secondary | ICD-10-CM

## 2019-12-15 NOTE — Telephone Encounter (Signed)
Mr. Skelley calling for CT results for his wife  Please call the patient

## 2019-12-15 NOTE — Telephone Encounter (Signed)
IMPRESSION: 1. 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 Note that a mass of this size places right ovary at potential increased risk for torsion. If there is clinical concern for potential intermittent torsion, correlation with pelvic ultrasound including Doppler assessment may be warranted.  2. No renal or ureteral calculi evident. No hydronephrosis on either side evident. Urinary bladder wall thickness is normal.  3. No bowel wall thickening or bowel obstruction. No abscess in the abdomen pelvis. Appendix region appears normal.

## 2019-12-15 NOTE — Telephone Encounter (Signed)
Noted. Will let patient know results. I do not think the cyst is responsible for any of her symptoms. Will advise follow-up with her gynecologist. See other phone note.

## 2019-12-15 NOTE — Telephone Encounter (Signed)
Patient know her urine culture showed no infection.  CT showed no kidney stones and bladder appears normal. Her bowel and appendix are normal. She does have a right ovarian benign cyst. It is large and this increases the risk of ovarian torsion which is a twisting of the ovary. I do not think this is currently causing her any symptoms because it would be pain in her lower right side. As long as she is not having any symptoms she does not need to do anything about this now, but her gynecologist should know about this. We can fax the CT scan to her/him and she can discuss this with them at her next visit. As long as she does not have any symptoms she does not need to follow-up sooner.

## 2019-12-15 NOTE — Telephone Encounter (Signed)
Referral ordered

## 2020-03-07 ENCOUNTER — Other Ambulatory Visit (HOSPITAL_COMMUNITY)
Admission: RE | Admit: 2020-03-07 | Discharge: 2020-03-07 | Disposition: A | Discharge status: Home / Self Care | Payer: BC Managed Care – PPO | Source: Ambulatory Visit | Attending: Obstetrics and Gynecology | Admitting: Obstetrics and Gynecology

## 2020-03-07 ENCOUNTER — Encounter: Payer: Self-pay | Admitting: Obstetrics and Gynecology

## 2020-03-07 ENCOUNTER — Ambulatory Visit (INDEPENDENT_AMBULATORY_CARE_PROVIDER_SITE_OTHER): Payer: BC Managed Care – PPO | Admitting: Obstetrics and Gynecology

## 2020-03-07 ENCOUNTER — Other Ambulatory Visit: Payer: Self-pay

## 2020-03-07 VITALS — BP 138/80 | HR 80 | Ht 59.75 in | Wt 147.0 lb

## 2020-03-07 DIAGNOSIS — Z124 Encounter for screening for malignant neoplasm of cervix: Secondary | ICD-10-CM | POA: Insufficient documentation

## 2020-03-07 DIAGNOSIS — N83201 Unspecified ovarian cyst, right side: Secondary | ICD-10-CM

## 2020-03-07 DIAGNOSIS — Z01419 Encounter for gynecological examination (general) (routine) without abnormal findings: Secondary | ICD-10-CM

## 2020-03-07 NOTE — H&P (View-Only) (Signed)
55 y.o. [id number] Married Other or two or more races Not Hispanic or Latino female here as a new patient to establish care and for an ovarian cyst.  No vaginal bleeding.   The patient had a CT scan in 11/21 for flank pain. GYN portion of the exam revealed: Reproductive: Uterus is midline. There is a 7 mm nabothian cyst arising in the cervix anteriorly. There is a mixed attenuation mass arising in the right adnexa measuring 5.4 x 5.5 x 4.5 cm, an apparent right ovarian dermoid. No other adnexal mass evident.    Patient's last menstrual period was 04/24/2016.          Sexually active: No.  The current method of family planning is post menopausal status.    Exercising: Yes.    workouts daily Smoker:  no  Health Maintenance: Pap:  10/29/15 Neg HR HPV History of abnormal Pap:  no MMG:  2-3 years ago per patient - normal, declines.  BMD:   never Colonoscopy: 03/23/16 f/u 10 years TDaP:  07/01/12 Gardasil: n/a   reports that she has never smoked. She has never used smokeless tobacco. She reports current alcohol use. She reports that she does not use drugs. Just occasional ETOH. She is a Agricultural engineer.   Past Medical History:  Diagnosis Date  . Allergy   . Arthritis   . Hypertension   . Nevus   . Other and unspecified hyperlipidemia     Past Surgical History:  Procedure Laterality Date  . NONE      Current Outpatient Medications  Medication Sig Dispense Refill  . fluticasone (FLONASE) 50 MCG/ACT nasal spray Place 2 sprays into both nostrils daily. 16 g 6  . losartan-hydrochlorothiazide (HYZAAR) 50-12.5 MG tablet Take 0.5 tablets by mouth daily. 90 tablet 3  . meloxicam (MOBIC) 7.5 MG tablet TAKE ONE TABLET BY MOUTH DAILY WITH FOOD 90 tablet 3  . rosuvastatin (CRESTOR) 5 MG tablet Take 1 tablet (5 mg total) by mouth daily. 90 tablet 3   No current facility-administered medications for this visit.    Family History  Problem Relation Age of Onset  . Hypertension Mother   . Heart  disease Mother 86       AMI   . Hyperlipidemia Mother   . Hypertension Sister   . Hyperlipidemia Sister   . Stroke Sister   . Colon cancer Sister   . Esophageal cancer Neg Hx   . Rectal cancer Neg Hx   . Stomach cancer Neg Hx   . Liver cancer Neg Hx     Review of Systems  Constitutional: Negative.   HENT: Negative.   Eyes: Negative.   Respiratory: Negative.   Cardiovascular: Negative.   Gastrointestinal: Negative.   Endocrine: Negative.   Genitourinary: Negative.   Musculoskeletal: Negative.   Skin: Negative.   Allergic/Immunologic: Negative.   Neurological: Negative.   Hematological: Negative.   Psychiatric/Behavioral: Negative.     Exam:   BP 138/80 (BP Location: Left Arm, Patient Position: Sitting, Cuff Size: Normal)   Pulse 80   Ht 4' 11.75" (1.518 m)   Wt 147 lb (66.7 kg)   LMP 04/24/2016   BMI 28.95 kg/m   Weight change: @WEIGHTCHANGE @ Height:   Height: 4' 11.75" (151.8 cm)  Ht Readings from Last 3 Encounters:  03/07/20 4' 11.75" (1.518 m)  12/13/19 5' (1.524 m)  05/26/19 5' (1.524 m)    General appearance: alert, cooperative and appears stated age Head: Normocephalic, without obvious abnormality, atraumatic Neck: no adenopathy, supple,  symmetrical, trachea midline and thyroid normal to inspection and palpation Lungs: clear to auscultation bilaterally Cardiovascular: regular rate and rhythm Breasts: normal appearance, no masses or tenderness Abdomen: soft, non-tender; non distended,  no masses,  no organomegaly Extremities: extremities normal, atraumatic, no cyanosis or edema Skin: Skin color, texture, turgor normal. No rashes or lesions Lymph nodes: Cervical, supraclavicular, and axillary nodes normal. No abnormal inguinal nodes palpated Neurologic: Grossly normal   Pelvic: External genitalia:  no lesions              Urethra:  normal appearing urethra with no masses, tenderness or lesions              Bartholins and Skenes: normal                  Vagina: normal appearing vagina with normal color and discharge, no lesions              Cervix: no lesions               Bimanual Exam:  Uterus:  normal size, contour, position, consistency, mobility, non-tender              Adnexa: fullness in the right adnexa. Not tender.                Rectovaginal: Confirms               Anus:  normal sphincter tone, no lesions  Taylor Mitchell chaperoned for the exam.  1. Well woman exam Discussed breast self exam Discussed calcium and vit D intake She declines mammogram, discussed this could delay diagnosis of cancer  2. Screening for cervical cancer  - Cytology - PAP  3. 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 Discussed up to 2% risk of malignant transformation, risk of torsion and small risk of rupture -Recommended laparoscopy with RSO and left salpingectomy. Discussed risks, including: infection, bleeding, damage to nearby organs (bowel, bladder, blood vessels and ureters). Discussed recovery.    Per patient's request, I called her husband and reviewed the cyst and surgery recommendations with her husband. He is in agreement.   In addition to the annual exam, over 15 minutes was spent in counseling on ovarian dermoid and management.  

## 2020-03-07 NOTE — Patient Instructions (Addendum)
Diagnostic Laparoscopy Diagnostic laparoscopy is a procedure to diagnose problems in the abdomen. It might be done for a variety of reasons, such as to look for scar tissue, a reason for abdominal pain, an abdominal mass or tumor, or fluid in the abdomen (ascites). This procedure may also be done to remove a tissue sample from the liver to look at under a microscope (biopsy). During the procedure, a thin, flexible tube that has a light and a camera on the end (laparoscope) is inserted through a small incision in the abdomen. The image from the camera is shown on a monitor to help the surgeon see inside the body. Tell a health care provider about:  Any allergies you have.  All medicines you are taking, including vitamins, herbs, eye drops, creams, and over-the-counter medicines.  Any problems you or family members have had with anesthetic medicines.  Any blood disorders you have.  Any surgeries you have had.  Any medical conditions you have.  Whether you are pregnant or may be pregnant. What are the risks? Generally, this is a safe procedure. However, problems may occur, including:  Infection.  Bleeding.  Allergic reactions to medicines or dyes.  Damage to abdominal structures or organs, such as the intestines, liver, stomach, or spleen. What happens before the procedure? Staying hydrated Follow instructions from your health care provider about hydration, which may include:  Up to 2 hours before the procedure - you may continue to drink clear liquids, such as water, clear fruit juice, black coffee, and plain tea.   Eating and drinking restrictions Follow instructions from your health care provider about eating and drinking, which may include:  8 hours before the procedure - stop eating heavy meals or foods, such as meat, fried foods, or fatty foods.  6 hours before the procedure - stop eating light meals or foods, such as toast or cereal.  6 hours before the procedure - stop  drinking milk or drinks that contain milk.  2 hours before the procedure - stop drinking clear liquids. Medicines Ask your health care provider about:  Changing or stopping your regular medicines. This is especially important if you are taking diabetes medicines or blood thinners.  Taking medicines such as aspirin and ibuprofen. These medicines can thin your blood. Do not take these medicines unless your health care provider tells you to take them.  Taking over-the-counter medicines, vitamins, herbs, and supplements. General instructions  Ask your health care provider: ? How your surgery site will be marked. ? What steps will be taken to help prevent infection. These steps may include:  Removing hair at the surgery site.  Washing skin with a germ-killing soap.  Taking antibiotic medicine.  Plan to have a responsible adult take you home from the hospital or clinic.  Plan to have a responsible adult care for you for the time you are told after you leave the hospital or clinic. This is important. What happens during the procedure?  An IV will be inserted into one of your veins.  You will be given one or more of the following: ? A medicine to help you relax (sedative). ? A medicine to numb the area (local anesthetic). ? A medicine to make you fall asleep (general anesthetic).  A breathing tube will be placed down your throat to help you breathe during the procedure.  Your abdomen will be filled with an air-like gas so that your abdomen expands. This will give the surgeon more room to operate and will make  your organs easier to see.  Many small incisions will be made in your abdomen.  A laparoscope and other surgical instruments will be inserted into your abdomen through these incisions.  A biopsy may be done. This will depend on the reason why you are having this procedure.  The laparoscope and other instruments will be removed from your abdomen.  The air-like gas will be  released from your abdomen.  Your incisions will be closed with stitches (sutures), skin glue, or surgical tapes and covered with a bandage (dressing).  Your breathing tube will be removed. The procedure may vary among health care providers and hospitals.   What happens after the procedure?  Your blood pressure, heart rate, breathing rate, and blood oxygen level will be monitored until you leave the hospital or clinic.  If you were given a sedative during the procedure, it can affect you for several hours. Do not drive or operate machinery until your health care provider says that it is safe.  It is up to you to get the results of your procedure. Ask your health care provider, or the department that is doing the procedure, when your results will be ready. Summary  Diagnostic laparoscopy is a procedure to diagnose problems in the abdomen using a thin, flexible tube that has a light and a camera on the end (laparoscope).  Follow instructions from your health care provider about how to prepare for the procedure.  Plan to have a responsible adult care for you for the time you are told after you leave the hospital or clinic. This is important. This information is not intended to replace advice given to you by your health care provider. Make sure you discuss any questions you have with your health care provider. Document Revised: 09/15/2019 Document Reviewed: 09/15/2019 Elsevier Patient Education  2021 [place]   We recommended that you start or continue a regular exercise program for good health. Physical activity is anything that gets your body moving, some is better than none. The CDC recommends 150 minutes per week of Moderate-Intensity Aerobic Activity and 2 or more days of Muscle Strengthening Activity.  Benefits of exercise are limitless: helps weight loss/weight maintenance, improves mood and energy, helps with depression and anxiety, improves sleep, tones and strengthens  muscles, improves balance, improves bone density, protects from chronic conditions such as heart disease, high blood pressure and diabetes and so much more. To learn more visit: WhyNotPoker.uy  DIET: Good nutrition starts with a healthy diet of fruits, vegetables, whole grains, and lean protein sources. Drink plenty of water for hydration. Minimize empty calories, sodium, sweets. For more information about dietary recommendations visit: GeekRegister.com.ee and http://schaefer-mitchell.com/  ALCOHOL:  Women should limit their alcohol intake to no more than 7 drinks/beers/glasses of wine (combined, not each!) per week. Moderation of alcohol intake to this level decreases your risk of breast cancer and liver damage.  If you are concerned that you may have a problem, or your friends have told you they are concerned about your drinking, there are many resources to help. A well-known program that is free, effective, and available to all people all over the nation is Alcoholics Anonymous.  Check out this site to learn more: BlockTaxes.se   CALCIUM AND VITAMIN D:  Adequate intake of calcium and Vitamin D are recommended for bone health.  You should be getting between 1000-1200 mg of calcium and 800 units of Vitamin D daily between diet and supplements  PAP SMEARS:  Pap smears,  to check for cervical cancer or precancers,  have traditionally been done yearly, scientific advances have shown that most women can have pap smears less often.  However, every woman still should have a physical exam from her gynecologist every year. It will include a breast check, inspection of the vulva and vagina to check for abnormal growths or skin changes, a visual exam of the cervix, and then an exam to evaluate the size and shape of the uterus and ovaries. We will also provide age appropriate advice regarding health maintenance, like when you  should have certain vaccines, screening for sexually transmitted diseases, bone density testing, colonoscopy, mammograms, etc.   MAMMOGRAMS:  All women over 68 years old should have a routine mammogram.   COLON CANCER SCREENING: Now recommend starting at age 67. At this time colonoscopy is not covered for routine screening until 50. There are take home tests that can be done between 45-49.   COLONOSCOPY:  Colonoscopy to screen for colon cancer is recommended for all women at age 3.  We know, you hate the idea of the prep.  We agree, BUT, having colon cancer and not knowing it is worse!!  Colon cancer so often starts as a polyp that can be seen and removed at colonscopy, which can quite literally save your life!  And if your first colonoscopy is normal and you have no family history of colon cancer, most women don't have to have it again for 10 years.  Once every ten years, you can do something that may end up saving your life, right?  We will be happy to help you get it scheduled when you are ready.  Be sure to check your insurance coverage so you understand how much it will cost.  It may be covered as a preventative service at no cost, but you should check your particular policy.      Breast Self-Awareness Breast self-awareness means being familiar with how your breasts look and feel. It involves checking your breasts regularly and reporting any changes to your health care provider. Practicing breast self-awareness is important. A change in your breasts can be a sign of a serious medical problem. Being familiar with how your breasts look and feel allows you to find any problems early, when treatment is more likely to be successful. All women should practice breast self-awareness, including women who have had breast implants. How to do a breast self-exam One way to learn what is normal for your breasts and whether your breasts are changing is to do a breast self-exam. To do a breast self-exam: Look  for Changes  1. Remove all the clothing above your waist. 2. Stand in front of a mirror in a room with good lighting. 3. Put your hands on your hips. 4. Push your hands firmly downward. 5. Compare your breasts in the mirror. Look for differences between them (asymmetry), such as: ? Differences in shape. ? Differences in size. ? Puckers, dips, and bumps in one breast and not the other. 6. Look at each breast for changes in your skin, such as: ? Redness. ? Scaly areas. 7. Look for changes in your nipples, such as: ? Discharge. ? Bleeding. ? Dimpling. ? Redness. ? A change in position. Feel for Changes Carefully feel your breasts for lumps and changes. It is best to do this while lying on your back on the floor and again while sitting or standing in the shower or tub with soapy water on your skin. Feel each  breast in the following way:  Place the arm on the side of the breast you are examining above your head.  Feel your breast with the other hand.  Start in the nipple area and make  inch (2 cm) overlapping circles to feel your breast. Use the pads of your three middle fingers to do this. Apply light pressure, then medium pressure, then firm pressure. The light pressure will allow you to feel the tissue closest to the skin. The medium pressure will allow you to feel the tissue that is a little deeper. The firm pressure will allow you to feel the tissue close to the ribs.  Continue the overlapping circles, moving downward over the breast until you feel your ribs below your breast.  Move one finger-width toward the center of the body. Continue to use the  inch (2 cm) overlapping circles to feel your breast as you move slowly up toward your collarbone.  Continue the up and down exam using all three pressures until you reach your armpit.  Write Down What You Find  Write down what is normal for each breast and any changes that you find. Keep a written record with breast changes or  normal findings for each breast. By writing this information down, you do not need to depend only on memory for size, tenderness, or location. Write down where you are in your menstrual cycle, if you are still menstruating. If you are having trouble noticing differences in your breasts, do not get discouraged. With time you will become more familiar with the variations in your breasts and more comfortable with the exam. How often should I examine my breasts? Examine your breasts every month. If you are breastfeeding, the best time to examine your breasts is after a feeding or after using a breast pump. If you menstruate, the best time to examine your breasts is 5-7 days after your period is over. During your period, your breasts are lumpier, and it may be more difficult to notice changes. When should I see my health care provider? See your health care provider if you notice:  A change in shape or size of your breasts or nipples.  A change in the skin of your breast or nipples, such as a reddened or scaly area.  Unusual discharge from your nipples.  A lump or thick area that was not there before.  Pain in your breasts.  Anything that concerns you.

## 2020-03-07 NOTE — Progress Notes (Signed)
55 y.o. G0P0000 Married Other or two or more races Not Hispanic or Latino female here as a new patient to establish care and for an ovarian cyst.  No vaginal bleeding.   The patient had a CT scan in 11/21 for flank pain. GYN portion of the exam revealed: Reproductive: Uterus is midline. There is a 7 mm nabothian cyst arising in the cervix anteriorly. There is a mixed attenuation mass arising in the right adnexa measuring 5.4 x 5.5 x 4.5 cm, an apparent right ovarian dermoid. No other adnexal mass evident.    Patient's last menstrual period was 04/24/2016.          Sexually active: No.  The current method of family planning is post menopausal status.    Exercising: Yes.    workouts daily Smoker:  no  Health Maintenance: Pap:  10/29/15 Neg HR HPV History of abnormal Pap:  no MMG:  2-3 years ago per patient - normal, declines.  BMD:   never Colonoscopy: 03/23/16 f/u 10 years TDaP:  07/01/12 Gardasil: n/a   reports that she has never smoked. She has never used smokeless tobacco. She reports current alcohol use. She reports that she does not use drugs. Just occasional ETOH. She is a Agricultural engineer.   Past Medical History:  Diagnosis Date  . Allergy   . Arthritis   . Hypertension   . Nevus   . Other and unspecified hyperlipidemia     Past Surgical History:  Procedure Laterality Date  . NONE      Current Outpatient Medications  Medication Sig Dispense Refill  . fluticasone (FLONASE) 50 MCG/ACT nasal spray Place 2 sprays into both nostrils daily. 16 g 6  . losartan-hydrochlorothiazide (HYZAAR) 50-12.5 MG tablet Take 0.5 tablets by mouth daily. 90 tablet 3  . meloxicam (MOBIC) 7.5 MG tablet TAKE ONE TABLET BY MOUTH DAILY WITH FOOD 90 tablet 3  . rosuvastatin (CRESTOR) 5 MG tablet Take 1 tablet (5 mg total) by mouth daily. 90 tablet 3   No current facility-administered medications for this visit.    Family History  Problem Relation Age of Onset  . Hypertension Mother   . Heart  disease Mother 86       AMI   . Hyperlipidemia Mother   . Hypertension Sister   . Hyperlipidemia Sister   . Stroke Sister   . Colon cancer Sister   . Esophageal cancer Neg Hx   . Rectal cancer Neg Hx   . Stomach cancer Neg Hx   . Liver cancer Neg Hx     Review of Systems  Constitutional: Negative.   HENT: Negative.   Eyes: Negative.   Respiratory: Negative.   Cardiovascular: Negative.   Gastrointestinal: Negative.   Endocrine: Negative.   Genitourinary: Negative.   Musculoskeletal: Negative.   Skin: Negative.   Allergic/Immunologic: Negative.   Neurological: Negative.   Hematological: Negative.   Psychiatric/Behavioral: Negative.     Exam:   BP 138/80 (BP Location: Left Arm, Patient Position: Sitting, Cuff Size: Normal)   Pulse 80   Ht 4' 11.75" (1.518 m)   Wt 147 lb (66.7 kg)   LMP 04/24/2016   BMI 28.95 kg/m   Weight change: @WEIGHTCHANGE @ Height:   Height: 4' 11.75" (151.8 cm)  Ht Readings from Last 3 Encounters:  03/07/20 4' 11.75" (1.518 m)  12/13/19 5' (1.524 m)  05/26/19 5' (1.524 m)    General appearance: alert, cooperative and appears stated age Head: Normocephalic, without obvious abnormality, atraumatic Neck: no adenopathy, supple,  symmetrical, trachea midline and thyroid normal to inspection and palpation Lungs: clear to auscultation bilaterally Cardiovascular: regular rate and rhythm Breasts: normal appearance, no masses or tenderness Abdomen: soft, non-tender; non distended,  no masses,  no organomegaly Extremities: extremities normal, atraumatic, no cyanosis or edema Skin: Skin color, texture, turgor normal. No rashes or lesions Lymph nodes: Cervical, supraclavicular, and axillary nodes normal. No abnormal inguinal nodes palpated Neurologic: Grossly normal   Pelvic: External genitalia:  no lesions              Urethra:  normal appearing urethra with no masses, tenderness or lesions              Bartholins and Skenes: normal                  Vagina: normal appearing vagina with normal color and discharge, no lesions              Cervix: no lesions               Bimanual Exam:  Uterus:  normal size, contour, position, consistency, mobility, non-tender              Adnexa: fullness in the right adnexa. Not tender.                Rectovaginal: Confirms               Anus:  normal sphincter tone, no lesions  Terence Lux chaperoned for the exam.  1. Well woman exam Discussed breast self exam Discussed calcium and vit D intake She declines mammogram, discussed this could delay diagnosis of cancer  2. Screening for cervical cancer  - Cytology - PAP  3. 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 Discussed up to 2% risk of malignant transformation, risk of torsion and small risk of rupture -Recommended laparoscopy with RSO and left salpingectomy. Discussed risks, including: infection, bleeding, damage to nearby organs (bowel, bladder, blood vessels and ureters). Discussed recovery.    Per patient's request, I called her husband and reviewed the cyst and surgery recommendations with her husband. He is in agreement.   In addition to the annual exam, over 15 minutes was spent in counseling on ovarian dermoid and management.

## 2020-03-11 LAB — CYTOLOGY - PAP
Comment: NEGATIVE
Diagnosis: NEGATIVE
High risk HPV: NEGATIVE

## 2020-03-13 ENCOUNTER — Telehealth: Payer: Self-pay

## 2020-03-13 NOTE — Telephone Encounter (Signed)
Spoke with patient regarding surgery benefits. Patient would like to have her husband call back to discuss surgery benefits.  Patient has questions regarding surgery. Patient stated that a cyst was seen on her right ovary, but that she experiences pain on her left side. Patient is wanting to know if Dr. Talbert Nan would suggest that the left ovary be removed as well, during surgery.

## 2020-03-13 NOTE — Telephone Encounter (Signed)
Spoke with patient's husband, Ashlee Davis, [place] per DPR, regarding surgery benefits. Ferdie acknowledges understanding of information presented. Understands that benefits presented are professional benefits only and that once surgery is scheduled, the hospital will call with separate benefits. See account note.  Routing to Glorianne Manchester, Therapist, sports, for surgery scheduling.

## 2020-03-15 NOTE — Telephone Encounter (Signed)
Call to patient, no answer. No option to leave voicemail. No alternative number, MyChart not active.

## 2020-03-18 NOTE — Telephone Encounter (Signed)
Spoke with patient and her spouse, reviewed potential surgery dates. Patient request to proceed with surgery on 04/01/20. Advised I will proceed with scheduling and f/u once confirmed.    Patient reports right ovarian cyst,  not experiencing pain on right side. Reports intermittent LLQ/pelvic pain 3/10, this is new since her AEX on 03/07/20.  Reports normal BMs, Denies N/V, fever/chills. Patient wants to ensure that surgery will address left side pain. Advised patient I will review with Dr. Talbert Nan and f/u with recommendations, advised may need OV for further evaluation, patient agreeable.   Surgery request submitted for 04/01/20.  Dr. Talbert Nan -please review and advise.

## 2020-03-19 NOTE — Telephone Encounter (Signed)
Spoke with patient, advised as seen below per Dr. Talbert Nan.  Patient declines OV. Patient denies any pain today.  Confirmed surgery scheduled for 04/01/20, [facility] at 0730.  Patient request RN return call later today to review pre-op instructions when spouse is present.

## 2020-03-19 NOTE — Telephone Encounter (Signed)
Spoke with patient and spouse. Surgery date request confirmed.  Advised surgery is scheduled for 04/01/20, [facility], 0730.  Surgery instruction sheet and hospital brochure reviewed, printed copy will be mailed.  Patient advised if Covid screening and quarantine requirements and agreeable.  Advised patient to return call to office if any additional questions or concerns.   Routing to provider. Encounter closed.

## 2020-03-19 NOTE — Telephone Encounter (Signed)
Please let the patient know that I always inspect the pelvis at time of laparoscopy. I would not recommend removeal the left ovary if it is normal in appearance.  She should come in for further evaluation and discussion.

## 2020-03-27 ENCOUNTER — Other Ambulatory Visit: Payer: Self-pay

## 2020-03-27 ENCOUNTER — Encounter (HOSPITAL_BASED_OUTPATIENT_CLINIC_OR_DEPARTMENT_OTHER): Payer: Self-pay | Admitting: Obstetrics and Gynecology

## 2020-03-27 NOTE — Progress Notes (Addendum)
Spoke w/ via phone for pre-op interview---pt Lab needs dos----  Cbc cmp ekg ( pt unable to come for pre op lab work)             Lab results-----none- COVID test ------03-28-2020 1420 Arrive at -------530 a 04-01-2020 NPO after MN NO Solid Food.   water from MN until---430 am Medications to take morning of surgery -----none Diabetic medication -----n/a Patient Special Instructions -----none Pre-Op special Istructions -----none Patient verbalized understanding of instructions that were given at this phone interview. Patient denies shortness of breath, chest pain, fever, cough at this phone interview.   when asked Pt declined to bring photo id and insurance card dos stating " it's in the system"

## 2020-03-28 ENCOUNTER — Other Ambulatory Visit (HOSPITAL_COMMUNITY): Payer: BC Managed Care – PPO

## 2020-03-29 ENCOUNTER — Other Ambulatory Visit (HOSPITAL_COMMUNITY)
Admission: RE | Admit: 2020-03-29 | Discharge: 2020-03-29 | Disposition: A | Discharge status: Home / Self Care | Payer: BC Managed Care – PPO | Source: Ambulatory Visit | Attending: Obstetrics and Gynecology | Admitting: Obstetrics and Gynecology

## 2020-03-29 ENCOUNTER — Other Ambulatory Visit (HOSPITAL_COMMUNITY): Payer: BC Managed Care – PPO

## 2020-03-29 DIAGNOSIS — Z20822 Contact with and (suspected) exposure to covid-19: Secondary | ICD-10-CM | POA: Insufficient documentation

## 2020-03-29 DIAGNOSIS — D270 Benign neoplasm of right ovary: Secondary | ICD-10-CM | POA: Diagnosis not present

## 2020-03-29 DIAGNOSIS — Z01812 Encounter for preprocedural laboratory examination: Secondary | ICD-10-CM | POA: Insufficient documentation

## 2020-03-29 DIAGNOSIS — Z79899 Other long term (current) drug therapy: Secondary | ICD-10-CM | POA: Diagnosis not present

## 2020-03-29 DIAGNOSIS — Z791 Long term (current) use of non-steroidal anti-inflammatories (NSAID): Secondary | ICD-10-CM | POA: Diagnosis not present

## 2020-03-29 LAB — SARS CORONAVIRUS 2 (TAT 6-24 HRS): SARS Coronavirus 2: NEGATIVE

## 2020-04-01 ENCOUNTER — Encounter (HOSPITAL_BASED_OUTPATIENT_CLINIC_OR_DEPARTMENT_OTHER): Payer: Self-pay | Admitting: Obstetrics and Gynecology

## 2020-04-01 ENCOUNTER — Ambulatory Visit (HOSPITAL_BASED_OUTPATIENT_CLINIC_OR_DEPARTMENT_OTHER): Payer: BC Managed Care – PPO | Admitting: Certified Registered Nurse Anesthetist

## 2020-04-01 ENCOUNTER — Ambulatory Visit (HOSPITAL_BASED_OUTPATIENT_CLINIC_OR_DEPARTMENT_OTHER)
Admission: RE | Admit: 2020-04-01 | Discharge: 2020-04-01 | Disposition: A | Discharge status: Home / Self Care | Payer: BC Managed Care – PPO | Attending: Obstetrics and Gynecology | Admitting: Obstetrics and Gynecology

## 2020-04-01 ENCOUNTER — Encounter (HOSPITAL_BASED_OUTPATIENT_CLINIC_OR_DEPARTMENT_OTHER)
Admission: RE | Disposition: A | Discharge status: Home / Self Care | Payer: Self-pay | Source: Home / Self Care | Attending: Obstetrics and Gynecology

## 2020-04-01 DIAGNOSIS — Z791 Long term (current) use of non-steroidal anti-inflammatories (NSAID): Secondary | ICD-10-CM | POA: Insufficient documentation

## 2020-04-01 DIAGNOSIS — D270 Benign neoplasm of right ovary: Secondary | ICD-10-CM | POA: Diagnosis not present

## 2020-04-01 DIAGNOSIS — E7800 Pure hypercholesterolemia, unspecified: Secondary | ICD-10-CM | POA: Diagnosis not present

## 2020-04-01 DIAGNOSIS — K219 Gastro-esophageal reflux disease without esophagitis: Secondary | ICD-10-CM | POA: Diagnosis not present

## 2020-04-01 DIAGNOSIS — Z20822 Contact with and (suspected) exposure to covid-19: Secondary | ICD-10-CM | POA: Diagnosis not present

## 2020-04-01 DIAGNOSIS — Z79899 Other long term (current) drug therapy: Secondary | ICD-10-CM | POA: Diagnosis not present

## 2020-04-01 DIAGNOSIS — D282 Benign neoplasm of uterine tubes and ligaments: Secondary | ICD-10-CM | POA: Diagnosis not present

## 2020-04-01 DIAGNOSIS — N736 Female pelvic peritoneal adhesions (postinfective): Secondary | ICD-10-CM | POA: Diagnosis not present

## 2020-04-01 DIAGNOSIS — N83209 Unspecified ovarian cyst, unspecified side: Secondary | ICD-10-CM | POA: Diagnosis not present

## 2020-04-01 DIAGNOSIS — N83201 Unspecified ovarian cyst, right side: Secondary | ICD-10-CM | POA: Diagnosis not present

## 2020-04-01 HISTORY — PX: LAPAROSCOPIC BILATERAL SALPINGECTOMY: SHX5889

## 2020-04-01 HISTORY — DX: Unspecified ovarian cyst, right side: N83.201

## 2020-04-01 HISTORY — DX: Gastro-esophageal reflux disease without esophagitis: K21.9

## 2020-04-01 HISTORY — DX: Hyperlipidemia, unspecified: E78.5

## 2020-04-01 HISTORY — DX: Presence of spectacles and contact lenses: Z97.3

## 2020-04-01 LAB — CBC
HCT: 43.1 % (ref 36.0–46.0)
Hemoglobin: 14.5 g/dL (ref 12.0–15.0)
MCH: 30.7 pg (ref 26.0–34.0)
MCHC: 33.6 g/dL (ref 30.0–36.0)
MCV: 91.1 fL (ref 80.0–100.0)
Platelets: 253 10*3/uL (ref 150–400)
RBC: 4.73 MIL/uL (ref 3.87–5.11)
RDW: 11.9 % (ref 11.5–15.5)
WBC: 8.1 10*3/uL (ref 4.0–10.5)
nRBC: 0 % (ref 0.0–0.2)

## 2020-04-01 LAB — COMPREHENSIVE METABOLIC PANEL
ALT: 19 U/L (ref 0–44)
AST: 24 U/L (ref 15–41)
Albumin: 4.6 g/dL (ref 3.5–5.0)
Alkaline Phosphatase: 64 U/L (ref 38–126)
Anion gap: 15 (ref 5–15)
BUN: 12 mg/dL (ref 6–20)
CO2: 23 mmol/L (ref 22–32)
Calcium: 9.7 mg/dL (ref 8.9–10.3)
Chloride: 103 mmol/L (ref 98–111)
Creatinine, Ser: 0.63 mg/dL (ref 0.44–1.00)
GFR, Estimated: >60 mL/min (ref >60)
Glucose, Bld: 120 mg/dL — ABNORMAL HIGH (ref 70–99)
Potassium: 3.3 mmol/L — ABNORMAL LOW (ref 3.5–5.1)
Sodium: 141 mmol/L (ref 135–145)
Total Bilirubin: 1.3 mg/dL — ABNORMAL HIGH (ref 0.3–1.2)
Total Protein: 7.9 g/dL (ref 6.5–8.1)

## 2020-04-01 SURGERY — SALPINGECTOMY, BILATERAL, LAPAROSCOPIC
Anesthesia: General

## 2020-04-01 MED ORDER — EPHEDRINE SULFATE-NACL 50-0.9 MG/10ML-% IV SOSY
PREFILLED_SYRINGE | INTRAVENOUS | Status: DC | PRN
  Administered 2020-04-01: 10 mg via INTRAVENOUS

## 2020-04-01 MED ORDER — FENTANYL CITRATE (PF) 100 MCG/2ML IJ SOLN
INTRAMUSCULAR | Status: AC
  Filled 2020-04-01: qty 2

## 2020-04-01 MED ORDER — SUGAMMADEX SODIUM 200 MG/2ML IV SOLN
INTRAVENOUS | Status: DC | PRN
  Administered 2020-04-01: 200 mg via INTRAVENOUS

## 2020-04-01 MED ORDER — PROPOFOL 10 MG/ML IV BOLUS
INTRAVENOUS | Status: DC | PRN
Start: 2020-04-01 — End: 2020-04-01
  Administered 2020-04-01: 160 mg via INTRAVENOUS

## 2020-04-01 MED ORDER — KETOROLAC TROMETHAMINE 30 MG/ML IJ SOLN: 30.0000 mg | Freq: Once | INTRAMUSCULAR | Status: DC | PRN

## 2020-04-01 MED ORDER — SODIUM CHLORIDE 0.9 % IR SOLN
Status: DC | PRN
  Administered 2020-04-01: 3000 mL

## 2020-04-01 MED ORDER — ONDANSETRON HCL 4 MG/2ML IJ SOLN
INTRAMUSCULAR | Status: AC
  Filled 2020-04-01: qty 2

## 2020-04-01 MED ORDER — FENTANYL CITRATE (PF) 100 MCG/2ML IJ SOLN
25.0000 ug | INTRAMUSCULAR | Status: DC | PRN
Start: 2020-04-01 — End: 2020-04-01

## 2020-04-01 MED ORDER — MIDAZOLAM HCL 2 MG/2ML IJ SOLN
INTRAMUSCULAR | Status: AC
  Filled 2020-04-01: qty 2

## 2020-04-01 MED ORDER — ACETAMINOPHEN 500 MG PO TABS
1000.0000 mg | ORAL_TABLET | ORAL | Status: AC
  Administered 2020-04-01: 1000 mg via ORAL

## 2020-04-01 MED ORDER — IBUPROFEN 800 MG PO TABS: 800.0000 mg | ORAL_TABLET | Freq: Three times a day (TID) | ORAL | 0 refills | Status: DC | PRN

## 2020-04-01 MED ORDER — BUPIVACAINE HCL (PF) 0.25 % IJ SOLN
INTRAMUSCULAR | Status: DC | PRN
  Administered 2020-04-01: 10 mL

## 2020-04-01 MED ORDER — ACETAMINOPHEN 500 MG PO TABS
ORAL_TABLET | ORAL | Status: AC
  Filled 2020-04-01: qty 2

## 2020-04-01 MED ORDER — KETOROLAC TROMETHAMINE 30 MG/ML IJ SOLN
INTRAMUSCULAR | Status: DC | PRN
  Administered 2020-04-01: 30 mg via INTRAVENOUS

## 2020-04-01 MED ORDER — EPHEDRINE 5 MG/ML INJ
INTRAVENOUS | Status: AC
  Filled 2020-04-01: qty 10

## 2020-04-01 MED ORDER — LIDOCAINE HCL (PF) 2 % IJ SOLN
INTRAMUSCULAR | Status: AC
  Filled 2020-04-01: qty 5

## 2020-04-01 MED ORDER — OXYCODONE HCL 5 MG/5ML PO SOLN: 5.0000 mg | Freq: Once | ORAL | Status: DC | PRN

## 2020-04-01 MED ORDER — ROCURONIUM BROMIDE 10 MG/ML (PF) SYRINGE
PREFILLED_SYRINGE | INTRAVENOUS | Status: DC | PRN
  Administered 2020-04-01: 60 mg via INTRAVENOUS

## 2020-04-01 MED ORDER — LACTATED RINGERS IV SOLN: INTRAVENOUS | Status: DC

## 2020-04-01 MED ORDER — FENTANYL CITRATE (PF) 100 MCG/2ML IJ SOLN
INTRAMUSCULAR | Status: DC | PRN
  Administered 2020-04-01 (×2): 50 ug via INTRAVENOUS

## 2020-04-01 MED ORDER — LIDOCAINE 2% (20 MG/ML) 5 ML SYRINGE
INTRAMUSCULAR | Status: DC | PRN
  Administered 2020-04-01: 100 mg via INTRAVENOUS

## 2020-04-01 MED ORDER — HYDROCODONE-ACETAMINOPHEN 5-325 MG PO TABS: 1.0000 | ORAL_TABLET | ORAL | 0 refills | Status: DC | PRN

## 2020-04-01 MED ORDER — ONDANSETRON HCL 4 MG/2ML IJ SOLN: 4.0000 mg | Freq: Once | INTRAMUSCULAR | Status: DC | PRN

## 2020-04-01 MED ORDER — DEXAMETHASONE SODIUM PHOSPHATE 10 MG/ML IJ SOLN
INTRAMUSCULAR | Status: AC
  Filled 2020-04-01: qty 1

## 2020-04-01 MED ORDER — POVIDONE-IODINE 10 % EX SWAB: 2.0000 "application " | Freq: Once | CUTANEOUS | Status: DC

## 2020-04-01 MED ORDER — PROPOFOL 500 MG/50ML IV EMUL
INTRAVENOUS | Status: AC
  Filled 2020-04-01: qty 50

## 2020-04-01 MED ORDER — ONDANSETRON HCL 4 MG/2ML IJ SOLN
INTRAMUSCULAR | Status: DC | PRN
Start: 2020-04-01 — End: 2020-04-01
  Administered 2020-04-01: 4 mg via INTRAVENOUS

## 2020-04-01 MED ORDER — DEXAMETHASONE SODIUM PHOSPHATE 10 MG/ML IJ SOLN
INTRAMUSCULAR | Status: DC | PRN
  Administered 2020-04-01: 10 mg via INTRAVENOUS

## 2020-04-01 MED ORDER — MIDAZOLAM HCL 2 MG/2ML IJ SOLN
INTRAMUSCULAR | Status: DC | PRN
  Administered 2020-04-01: 2 mg via INTRAVENOUS

## 2020-04-01 MED ORDER — OXYCODONE HCL 5 MG PO TABS: 5.0000 mg | ORAL_TABLET | Freq: Once | ORAL | Status: DC | PRN

## 2020-04-01 MED ORDER — KETOROLAC TROMETHAMINE 30 MG/ML IJ SOLN
INTRAMUSCULAR | Status: AC
  Filled 2020-04-01: qty 1

## 2020-04-01 SURGICAL SUPPLY — 45 items
ADH SKN CLS APL DERMABOND .7 (GAUZE/BANDAGES/DRESSINGS) ×1
APL SRG 38 LTWT LNG FL B (MISCELLANEOUS)
APPLICATOR ARISTA FLEXITIP XL (MISCELLANEOUS) IMPLANT
BAG RETRIEVAL 10 (BASKET) ×1
CABLE HIGH FREQUENCY MONO STRZ (ELECTRODE) IMPLANT
CANISTER SUCT 3000ML PPV (MISCELLANEOUS) ×2 IMPLANT
COVER MAYO STAND STRL (DRAPES) ×2 IMPLANT
COVER WAND RF STERILE (DRAPES) ×2 IMPLANT
DERMABOND ADVANCED (GAUZE/BANDAGES/DRESSINGS) ×1
DERMABOND ADVANCED .7 DNX12 (GAUZE/BANDAGES/DRESSINGS) ×1 IMPLANT
DISSECTOR BLUNT TIP ENDO 5MM (MISCELLANEOUS) ×2 IMPLANT
DRSG OPSITE POSTOP 3X4 (GAUZE/BANDAGES/DRESSINGS) ×2 IMPLANT
DURAPREP 26ML APPLICATOR (WOUND CARE) ×2 IMPLANT
ELECT REM PT RETURN 9FT ADLT (ELECTROSURGICAL) ×2
ELECTRODE REM PT RTRN 9FT ADLT (ELECTROSURGICAL) ×1 IMPLANT
GAUZE 4X4 16PLY RFD (DISPOSABLE) ×2 IMPLANT
GLOVE SURG ENC MOIS LTX SZ6.5 (GLOVE) ×4 IMPLANT
GLOVE SURG UNDER POLY LF SZ7 (GLOVE) ×4 IMPLANT
GOWN STRL REUS W/TWL LRG LVL3 (GOWN DISPOSABLE) ×4 IMPLANT
HEMOSTAT ARISTA ABSORB 3G PWDR (HEMOSTASIS) IMPLANT
KIT TURNOVER CYSTO (KITS) ×2 IMPLANT
LIGASURE VESSEL 5MM BLUNT TIP (ELECTROSURGICAL) ×2 IMPLANT
NEEDLE INSUFFLATION 120MM (ENDOMECHANICALS) ×2 IMPLANT
NS IRRIG 500ML POUR BTL (IV SOLUTION) ×2 IMPLANT
PACK LAPAROSCOPY BASIN (CUSTOM PROCEDURE TRAY) ×2 IMPLANT
PAD OB MATERNITY 4.3X12.25 (PERSONAL CARE ITEMS) ×2 IMPLANT
POUCH LAPAROSCOPIC INSTRUMENT (MISCELLANEOUS) ×2 IMPLANT
SCISSORS LAP 5X35 DISP (ENDOMECHANICALS) IMPLANT
SET IRRIG Y TYPE TUR BLADDER L (SET/KITS/TRAYS/PACK) IMPLANT
SET SUCTION IRRIG HYDROSURG (IRRIGATION / IRRIGATOR) IMPLANT
SET TUBE SMOKE EVAC HIGH FLOW (TUBING) ×2 IMPLANT
SHEARS HARMONIC ACE PLUS 36CM (ENDOMECHANICALS) ×2 IMPLANT
STRIP CLOSURE SKIN 1/2X4 (GAUZE/BANDAGES/DRESSINGS) ×2 IMPLANT
SUT VIC AB 4-0 PS2 18 (SUTURE) ×2 IMPLANT
SUT VICRYL 0 UR6 27IN ABS (SUTURE) ×2 IMPLANT
SYS BAG RETRIEVAL 10MM (BASKET) ×1
SYSTEM BAG RETRIEVAL 10MM (BASKET) ×1 IMPLANT
SYSTEM CARTER THOMASON II (TROCAR) IMPLANT
TOWEL OR 17X26 10 PK STRL BLUE (TOWEL DISPOSABLE) ×2 IMPLANT
TRAY FOLEY W/BAG SLVR 14FR LF (SET/KITS/TRAYS/PACK) ×2 IMPLANT
TROCAR ADV FIXATION 11X100MM (TROCAR) ×4 IMPLANT
TROCAR ADV FIXATION 5X100MM (TROCAR) ×2 IMPLANT
TROCAR BLADELESS OPT 5 100 (ENDOMECHANICALS) ×2 IMPLANT
TROCAR XCEL NON-BLD 11X100MML (ENDOMECHANICALS) ×2 IMPLANT
WARMER LAPAROSCOPE (MISCELLANEOUS) ×2 IMPLANT

## 2020-04-01 NOTE — Transfer of Care (Signed)
Immediate Anesthesia Transfer of Care Note  Patient: Ashlee Davis  Procedure(s) Performed: Laparoscopy with right salpingectomy oophorectomy and left salpingectomy (N/A )  Patient Location: PACU  Anesthesia Type:General  Level of Consciousness: awake, alert  and oriented  Airway & Oxygen Therapy: Patient Spontanous Breathing and Patient connected to face mask oxygen  Post-op Assessment: Report given to RN and Post -op Vital signs reviewed and stable  Post vital signs: Reviewed and stable  Last Vitals:  Vitals Value Taken Time  BP 122/73 04/01/20 0855  Temp    Pulse 69 04/01/20 0858  Resp 17 04/01/20 0858  SpO2 98 % 04/01/20 0858  Vitals shown include unvalidated device data.  Last Pain:  Vitals:   04/01/20 0557  TempSrc: Oral  PainSc: 0-No pain      Patients Stated Pain Goal: 3 (71/21/97 5883)  Complications: No complications documented.

## 2020-04-01 NOTE — Discharge Instructions (Signed)
°  Post Anesthesia Home Care Instructions  Activity: Get plenty of rest for the remainder of the day. A responsible individual must stay with you for 24 hours following the procedure.  For the next 24 hours, DO NOT: -Drive a car -Paediatric nurse -Drink alcoholic beverages -Take any medication unless instructed by your physician -Make any legal decisions or sign important papers.  Meals: Start with liquid foods such as gelatin or soup. Progress to regular foods as tolerated. Avoid greasy, spicy, heavy foods. If nausea and/or vomiting occur, drink only clear liquids until the nausea and/or vomiting subsides. Call your physician if vomiting continues.  Special Instructions/Symptoms: Your throat may feel dry or sore from the anesthesia or the breathing tube placed in your throat during surgery. If this causes discomfort, gargle with warm salt water. The discomfort should disappear within 24 hours.  No ibuprofen, Advil, Aleve, Motrin, or naproxen until after 2:30 pm today if needed. No tylenol until after 12 pm (noon) today if needed.   DISCHARGE INSTRUCTIONS: Laparoscopy  The following instructions have been prepared to help you care for yourself upon your return home today.  Wound care:  Do not get the incision wet for the first 24 hours. The incision should be kept clean and dry.  The Band-Aids or dressings may be removed the day after surgery.  Should the incision become sore, red, and swollen after the first week, check with your doctor.  Personal hygiene:  Shower the day after your procedure.  Activity and limitations:  Do NOT drive or operate any equipment today.  Do NOT lift anything more than 15 pounds for 2-3 weeks after surgery.  Do NOT rest in bed all day.  Walking is encouraged. Walk each day, starting slowly with 5-minute walks 3 or 4 times a day. Slowly increase the length of your walks.  Walk up and down stairs slowly.  Do NOT do strenuous activities, such  as golfing, playing tennis, bowling, running, biking, weight lifting, gardening, mowing, or vacuuming for 2-4 weeks. Ask your doctor when it is okay to start.  Diet: Eat a light meal as desired this evening. You may resume your usual diet tomorrow.  Return to work: This is dependent on the type of work you do. For the most part you can return to a desk job within a week of surgery. If you are more active at work, please discuss this with your doctor.  What to expect after your surgery: You may have a slight burning sensation when you urinate on the first day. You may have a very small amount of blood in the urine. Expect to have a small amount of vaginal discharge/light bleeding for 1-2 weeks. It is not unusual to have abdominal soreness and bruising for up to 2 weeks. You may be tired and need more rest for about 1 week. You may experience shoulder pain for 24-72 hours. Lying flat in bed may relieve it.  Call your doctor for any of the following:  Develop a fever of 100.4 or greater  Inability to urinate 6 hours after discharge from hospital  Severe pain not relieved by pain medications  Persistent of heavy bleeding at incision site  Redness or swelling around incision site after a week  Increasing nausea or vomiting

## 2020-04-01 NOTE — Anesthesia Postprocedure Evaluation (Signed)
Anesthesia Post Note  Patient: Ashlee Davis  Procedure(s) Performed: Laparoscopy with right salpingectomy oophorectomy and left salpingectomy (N/A )     Patient location during evaluation: PACU Anesthesia Type: General Level of consciousness: awake and alert Pain management: pain level controlled Vital Signs Assessment: post-procedure vital signs reviewed and stable Respiratory status: spontaneous breathing, nonlabored ventilation, respiratory function stable and patient connected to nasal cannula oxygen Cardiovascular status: blood pressure returned to baseline and stable Postop Assessment: no apparent nausea or vomiting Anesthetic complications: no   No complications documented.  Last Vitals:  Vitals:   04/01/20 0930 04/01/20 1005  BP: 129/81 120/75  Pulse: 73 79  Resp: 13 14  Temp:  36.9 C  SpO2: 98% 100%    Last Pain:  Vitals:   04/01/20 1005  TempSrc:   PainSc: 2                  Brittanyann Wittner S

## 2020-04-01 NOTE — Anesthesia Preprocedure Evaluation (Signed)
Anesthesia Evaluation  Patient identified by MRN, date of birth, ID band Patient awake    Reviewed: Allergy & Precautions, H&P , NPO status , Patient's Chart, lab work & pertinent test results  Airway Mallampati: II  TM Distance: >3 FB Neck ROM: Full    Dental no notable dental hx.    Pulmonary neg pulmonary ROS,    Pulmonary exam normal breath sounds clear to auscultation       Cardiovascular hypertension, Pt. on medications Normal cardiovascular exam Rhythm:Regular Rate:Normal     Neuro/Psych negative neurological ROS  negative psych ROS   GI/Hepatic Neg liver ROS, GERD  ,  Endo/Other  negative endocrine ROS  Renal/GU negative Renal ROS  negative genitourinary   Musculoskeletal negative musculoskeletal ROS (+)   Abdominal   Peds negative pediatric ROS (+)  Hematology negative hematology ROS (+)   Anesthesia Other Findings   Reproductive/Obstetrics negative OB ROS                             Anesthesia Physical Anesthesia Plan  ASA: II  Anesthesia Plan: General   Post-op Pain Management:    Induction: Intravenous  PONV Risk Score and Plan: 3 and Ondansetron, Dexamethasone and Treatment may vary due to age or medical condition  Airway Management Planned: Oral ETT  Additional Equipment:   Intra-op Plan:   Post-operative Plan: Extubation in OR  Informed Consent: I have reviewed the patients History and Physical, chart, labs and discussed the procedure including the risks, benefits and alternatives for the proposed anesthesia with the patient or authorized representative who has indicated his/her understanding and acceptance.     Dental advisory given  Plan Discussed with: CRNA and Surgeon  Anesthesia Plan Comments:         Anesthesia Quick Evaluation

## 2020-04-01 NOTE — Interval H&P Note (Signed)
History and Physical Interval Note:  04/01/2020 7:03 AM  Ashlee Davis  has presented today for surgery, with the diagnosis of Right Ovarian Cyst.  The various methods of treatment have been discussed with the patient and family. After consideration of risks, benefits and other options for treatment, the patient has consented to  Procedure(s) with comments: Laparoscopy with right salpingectomy oophorectomy and left salpingectomy (N/A) - Laparoscopy with right salpingectomy oophorectomy and left salpingectomy as a surgical intervention.  The patient's history has been reviewed, patient examined, no change in status, stable for surgery.  I have reviewed the patient's chart and labs.  Questions were answered to the patient's satisfaction.     Salvadore Dom

## 2020-04-01 NOTE — Op Note (Addendum)
Preoperative Diagnosis: right ovarian dermoid  Postoperative Diagnosis: right ovarian dermoid, lysis of adhesions.  Procedure:  Laparoscopy, right salpingo oophorectomy, left salpingectomy  Surgeon: Dr Sumner Boast  Assistant: Dr Edwinna Areola, an MD assistant was necessary for tissue manipulation, retraction and positioning due to the complexity of the case and hospital policies   Anesthesia: General  EBL: 5 cc  Fluids: 1,000 cc LR  Urine output: 50 cc  Specimens: right tube and ovary, left tube,   Complications: none  Indications for surgery: The patient is a 55 year old female, who presented with an incidental finding of a 5.5 cm right ovarian dermoid noted on CT done for flank pain. Discussed risks of malignant transformation, torsion and rupture. The decision was made to proceed with surgery. The patient is aware of the risks and complications involved with the surgery and consent was obtained prior to the procedure.  Findings: ~6 cm right ovarian dermoid, adhesions of the right ovary to the pelvic side wall. Normal right tube. Adhesions of the colon to the left pelvic brim. Dense adhesions of the bowel to the posterior uterus and left ovary. Some adhesions of the left tube to the left ovary. Uterus with small myoma.  The right ureter was seen peristalsing under the area of the adhesions of the right ovary to the pelvic side wall. Normal liver edge.    Procedure: The patient was taken to the operating room with an IV in placed. She was placed in the dorsal lithotomy position. General anesthesia was administered. She was prepped and draped in the usual sterile fashion for an abdominal, vaginal surgery. A single tooth tenaculum was placed, the cervix was dilated with mini dilators, up to a #15 pratt dilator. The uterus was retroverted and the sound didn't go past the internal os, so the pratt dilator was connected to the single tooth tenaculum for uses as a uterine manipulator. A  foley catheter was placed.   The umbilicus was everted, injected with 0.25% marcaine and incised with a # 11 blade. 2 towel clips were used to elevated the umbilicus and a veress needle was placed into the abdominal cavity. The abdominal cavity was insufflated with CO2, with normal intraabdominal pressures. After adequate pneumo-insufflation the veress needle was removed and the 5 mm laparoscope was placed into the abdominal cavity using the opti-view trocar. The patient was placed in trendelenburg and the abdominal pelvic cavity was inspected. 3 more trocars were placed. 1 in the either  lower quadrant approximately 3 cm medial to the anterior superior iliac spine, and one approximately 5 cm superior to the pubic symphysis in the midline. These areas were injected with 0.25% marcaine, incised with a #11 blades. A 5 mm trocar was inserted in the left lower quadrant and the midline. An 11 mm trocar was placed in the right lower quadrant. All trocars were inserted with direct visualization with the laparoscope. The abdominal pelvic cavity was again inspected, pelvic washings were obtained. The ureter on the right was identified, the ureter on the left could not be seen.    The adhesions between the colon and the pelvic brim were taken down with the harmonic scalpel. This enabled visualization of the left adnexa. The left tube was grasped and separated from the adhesed ovary with the harmonic scalpel. The left mesosalpinx was taken down with the ligasure device and the tube was separated from the uterus at it connection with the ligasure device and removed through the #5 trocar. The dense adhesions between  the bowel the left ovary and the uterus were not taken down since they were not symptomatic and posed risk of injury.  The right utero-ovarian ligament was cauterized and ligated with the ligasure device. The mesosalpinx and mesoovarum were taken down with the ligasure device and the adnexa was separated from  the uterus with the ligasure device. This enabled good visualization of the ovary to the pelvic side wall. The ureter was below the adhesions. The pelvic side wall was opened with the harmonic scalpel and the ovary was dissected off of the pelvic side wall with a combination of the harmonic scalpel and blunt dissection. Hemostasis was excellent.    The specimens were placed in a endo bag and brought up through the 11 mm trocar. The trocar was removed and the specimen was morcellated within the bag and removed, the bag was removed and the trocar was replaced.    The abdominal pelvic cavity was irrigated and suctioned dry. Pressure was released and hemostasis remained excellent.   The 11 mm midline trocar was removed and the [employer] device was used to place a stich of 0-Vicryl through the fascia. The abdominal cavity was desufflated and the trocars were removed. The skin was closed with subcuticular stiches of 4-0 vicryl and dermabond was placed over the incisions.  The foley catheter and uterine manipulator were removed.   The patient's abdomen and perineum were cleansed and she was taken out of the dorsal lithotomy position. Upon awakening she was extubated and taken to the recovery room in stable condition. The sponge and instrument counts were correct.   Addendum: Dr Sabra Heck specifically placed the veress needle, helped with retraction, partially separated the right mesosalpinx and mesoovarium, placed the endobag and helped removing the dermoid from the bag.

## 2020-04-01 NOTE — Anesthesia Procedure Notes (Signed)
Procedure Name: Intubation Date/Time: 04/01/2020 7:26 AM Performed by: Genelle Bal, CRNA Pre-anesthesia Checklist: Patient identified, Emergency Drugs available, Suction available and Patient being monitored Patient Re-evaluated:Patient Re-evaluated prior to induction Oxygen Delivery Method: Circle system utilized Preoxygenation: Pre-oxygenation with 100% oxygen Induction Type: IV induction Ventilation: Mask ventilation without difficulty Laryngoscope Size: Miller and 2 Grade View: Grade I Tube type: Oral Tube size: 7.0 mm Number of attempts: 1 Airway Equipment and Method: Stylet Placement Confirmation: ETT inserted through vocal cords under direct vision,  positive ETCO2 and breath sounds checked- equal and bilateral Secured at: 21 cm Tube secured with: Tape Dental Injury: Teeth and Oropharynx as per pre-operative assessment

## 2020-04-02 ENCOUNTER — Encounter (HOSPITAL_BASED_OUTPATIENT_CLINIC_OR_DEPARTMENT_OTHER): Payer: Self-pay | Admitting: Obstetrics and Gynecology

## 2020-04-02 LAB — CYTOLOGY - NON PAP

## 2020-04-02 LAB — SURGICAL PATHOLOGY

## 2020-04-17 ENCOUNTER — Ambulatory Visit (INDEPENDENT_AMBULATORY_CARE_PROVIDER_SITE_OTHER): Payer: BC Managed Care – PPO | Admitting: Obstetrics and Gynecology

## 2020-04-17 ENCOUNTER — Other Ambulatory Visit: Payer: Self-pay

## 2020-04-17 ENCOUNTER — Encounter: Payer: Self-pay | Admitting: Obstetrics and Gynecology

## 2020-04-17 VITALS — BP 122/76 | HR 106 | Ht 59.0 in

## 2020-04-17 DIAGNOSIS — Z9889 Other specified postprocedural states: Secondary | ICD-10-CM

## 2020-04-17 DIAGNOSIS — Z90721 Acquired absence of ovaries, unilateral: Secondary | ICD-10-CM

## 2020-04-17 DIAGNOSIS — Z9079 Acquired absence of other genital organ(s): Secondary | ICD-10-CM

## 2020-04-17 NOTE — Patient Instructions (Signed)
Make an appointment for an annual exam for 2/23.   Call with any concerns

## 2020-04-17 NOTE — Progress Notes (Signed)
GYNECOLOGY  VISIT   HPI: 55 y.o.   Married Other or two or more races Not Hispanic or Latino  female   G0P0000 with Patient's last menstrual period was 04/24/2016.   here for 2 week post op  Laparoscopy with right salpingectomy oophorectomy and left salpingectomy. Pathology with benign dermoid. She is doing well, no complaints. Normal bowel and bladder function.  GYNECOLOGIC HISTORY: Patient's last menstrual period was 04/24/2016. Contraception: pmp  Menopausal hormone therapy: none        OB History    Gravida  0   Para  0   Term  0   Preterm  0   AB  0   Living  0     SAB  0   IAB  0   Ectopic  0   Multiple  0   Live Births  0              Patient Active Problem List   Diagnosis Date Noted  . Dysuria 12/13/2019  . Abdominal pain 12/13/2019  . Allergic rhinitis 05/26/2019  . Arthritis 12/23/2018  . Essential hypertension 04/08/2016  . Pure hypercholesterolemia 04/08/2016  . Family history of stroke 04/08/2016    Past Medical History:  Diagnosis Date  . Allergy   . Arthritis   . GERD (gastroesophageal reflux disease)   . Hyperlipidemia   . Hypertension   . Nevus   . Other and unspecified hyperlipidemia   . Right ovarian cyst   . Wears glasses     Past Surgical History:  Procedure Laterality Date  . colonscopy  age 69  . LAPAROSCOPIC BILATERAL SALPINGECTOMY N/A 04/01/2020   Procedure: Laparoscopy with right salpingectomy oophorectomy and left salpingectomy;  Surgeon: Salvadore Dom, MD;  Location: [facility];  Service: Gynecology;  Laterality: N/A;  Laparoscopy with right salpingectomy oophorectomy and left salpingectomy    Current Outpatient Medications  Medication Sig Dispense Refill  . fluticasone (FLONASE) 50 MCG/ACT nasal spray Place 2 sprays into both nostrils daily. (Patient taking differently: Place 2 sprays into both nostrils 2 (two) times daily as needed.) 16 g 6  . HYDROcodone-acetaminophen (NORCO/VICODIN) 5-325  MG tablet Take 1 tablet by mouth every 4 (four) hours as needed for moderate pain. 15 tablet 0  . ibuprofen (ADVIL) 800 MG tablet Take 1 tablet (800 mg total) by mouth every 8 (eight) hours as needed. 30 tablet 0  . losartan-hydrochlorothiazide (HYZAAR) 50-12.5 MG tablet Take 0.5 tablets by mouth daily. 90 tablet 3  . rosuvastatin (CRESTOR) 5 MG tablet Take 1 tablet (5 mg total) by mouth daily. (Patient taking differently: Take 5 mg by mouth at bedtime.) 90 tablet 3   No current facility-administered medications for this visit.     ALLERGIES: Wine [alcohol]  Family History  Problem Relation Age of Onset  . Hypertension Mother   . Heart disease Mother 33       AMI   . Hyperlipidemia Mother   . Hypertension Sister   . Hyperlipidemia Sister   . Stroke Sister   . Colon cancer Sister   . Esophageal cancer Neg Hx   . Rectal cancer Neg Hx   . Stomach cancer Neg Hx   . Liver cancer Neg Hx     Social History   Socioeconomic History  . Marital status: Married    Spouse name: Ferninand  . Number of children: 0  . Years of education: 61  . Highest education level: Not on file  Occupational History  .  Occupation: homemaker  Tobacco Use  . Smoking status: Never Smoker  . Smokeless tobacco: Never Used  Vaping Use  . Vaping Use: Never used  Substance and Sexual Activity  . Alcohol use: Yes    Comment: wine occasionally  . Drug use: Never  . Sexual activity: Not Currently    Partners: Male    Birth control/protection: Post-menopausal  Other Topics Concern  . Not on file  Social History Narrative   Marital status: married since 1999.  Happily married; no abuse.      Children:  None      Lives: with husband; from [place]; moved to [place] to [place].   From the [place], left at age 29 for the [place].      Tobacco: never      Alcohol: socially but rare due to skin flushing.      Exercises:  Walking every morning; works in garden a lot.   Always uses seat belts. Smoke alarm in  the home. No guns in the home.   Caffeine use: in moderation daily.   Social Determinants of Health   Financial Resource Strain: Not on file  Food Insecurity: Not on file  Transportation Needs: Not on file  Physical Activity: Not on file  Stress: Not on file  Social Connections: Not on file  Intimate Partner Violence: Not on file    ROS  PHYSICAL EXAMINATION:    BP 122/76   Pulse (!) 106   Ht 4\' 11"  (1.499 m)   LMP 04/24/2016   SpO2 97%   BMI 30.22 kg/m     General appearance: alert, cooperative and appears stated age Abdomen: soft, non-tender; non distended, no masses,  no organomegaly  Pelvic: External genitalia:  no lesions              Urethra:  normal appearing urethra with no masses, tenderness or lesions              Bartholins and Skenes: normal                 Cervix: no cervical motion tenderness              Bimanual Exam:  Uterus:  normal size, contour, position, consistency, mobility, non-tender              Adnexa: no mass, fullness, tenderness                Chaperone was present for exam.  1. History of laparoscopy With RSO and LS. Dermoid on pathology. Doing well Routine f/u  2. H/O unilateral oophorectomy   3. H/O bilateral salpingectomy

## 2020-05-29 ENCOUNTER — Other Ambulatory Visit: Payer: Self-pay | Admitting: Internal Medicine

## 2020-06-08 ENCOUNTER — Other Ambulatory Visit: Payer: Self-pay | Admitting: Internal Medicine

## 2020-06-11 ENCOUNTER — Other Ambulatory Visit: Payer: Self-pay | Admitting: Internal Medicine

## 2020-06-17 ENCOUNTER — Other Ambulatory Visit: Payer: Self-pay

## 2020-06-17 ENCOUNTER — Encounter: Payer: Self-pay | Admitting: Internal Medicine

## 2020-06-17 ENCOUNTER — Ambulatory Visit (INDEPENDENT_AMBULATORY_CARE_PROVIDER_SITE_OTHER): Payer: BC Managed Care – PPO | Admitting: Internal Medicine

## 2020-06-17 VITALS — BP 142/80 | HR 80 | Temp 97.9°F | Ht <= 58 in | Wt 148.9 lb

## 2020-06-17 DIAGNOSIS — M1990 Unspecified osteoarthritis, unspecified site: Secondary | ICD-10-CM

## 2020-06-17 DIAGNOSIS — Z0001 Encounter for general adult medical examination with abnormal findings: Secondary | ICD-10-CM

## 2020-06-17 DIAGNOSIS — I10 Essential (primary) hypertension: Secondary | ICD-10-CM | POA: Diagnosis not present

## 2020-06-17 DIAGNOSIS — E7800 Pure hypercholesterolemia, unspecified: Secondary | ICD-10-CM | POA: Diagnosis not present

## 2020-06-17 DIAGNOSIS — Z0000 Encounter for general adult medical examination without abnormal findings: Secondary | ICD-10-CM

## 2020-06-17 LAB — COMPREHENSIVE METABOLIC PANEL
ALT: 19 U/L (ref 0–35)
AST: 17 U/L (ref 0–37)
Albumin: 4.4 g/dL (ref 3.5–5.2)
Alkaline Phosphatase: 74 U/L (ref 39–117)
BUN: 15 mg/dL (ref 6–23)
CO2: 27 mEq/L (ref 19–32)
Calcium: 9.5 mg/dL (ref 8.4–10.5)
Chloride: 105 mEq/L (ref 96–112)
Creatinine, Ser: 0.7 mg/dL (ref 0.40–1.20)
GFR: 98.48 mL/min (ref >60.00)
Glucose, Bld: 112 mg/dL — ABNORMAL HIGH (ref 70–99)
Potassium: 3.8 mEq/L (ref 3.5–5.1)
Sodium: 141 mEq/L (ref 135–145)
Total Bilirubin: 0.9 mg/dL (ref 0.2–1.2)
Total Protein: 7.4 g/dL (ref 6.0–8.3)

## 2020-06-17 LAB — LIPID PANEL
Cholesterol: 168 mg/dL (ref 0–200)
HDL: 54.8 mg/dL (ref >39.00)
LDL Cholesterol: 80 mg/dL (ref 0–99)
NonHDL: 112.71
Total CHOL/HDL Ratio: 3
Triglycerides: 165 mg/dL — ABNORMAL HIGH (ref 0.0–149.0)
VLDL: 33 mg/dL (ref 0.0–40.0)

## 2020-06-17 LAB — CBC
HCT: 40.6 % (ref 36.0–46.0)
Hemoglobin: 13.9 g/dL (ref 12.0–15.0)
MCHC: 34.3 g/dL (ref 30.0–36.0)
MCV: 91.5 fl (ref 78.0–100.0)
Platelets: 230 10*3/uL (ref 150.0–400.0)
RBC: 4.44 Mil/uL (ref 3.87–5.11)
RDW: 12.5 % (ref 11.5–15.5)
WBC: 7 10*3/uL (ref 4.0–10.5)

## 2020-06-17 LAB — HEMOGLOBIN A1C: Hgb A1c MFr Bld: 6.3 % (ref 4.6–6.5)

## 2020-06-17 MED ORDER — MELOXICAM 15 MG PO TABS: 15.0000 mg | ORAL_TABLET | Freq: Every day | ORAL | 3 refills | Status: DC

## 2020-06-17 NOTE — Patient Instructions (Addendum)
We will check the blood work today.  Health Maintenance, Female Adopting a healthy lifestyle and getting preventive care are important in promoting health and wellness. Ask your health care provider about:  The right schedule for you to have regular tests and exams.  Things you can do on your own to prevent diseases and keep yourself healthy. What should I know about diet, weight, and exercise? Eat a healthy diet  Eat a diet that includes plenty of vegetables, fruits, low-fat dairy products, and lean protein.  Do not eat a lot of foods that are high in solid fats, added sugars, or sodium.   Maintain a healthy weight Body mass index (BMI) is used to identify weight problems. It estimates body fat based on height and weight. Your health care provider can help determine your BMI and help you achieve or maintain a healthy weight. Get regular exercise Get regular exercise. This is one of the most important things you can do for your health. Most adults should:  Exercise for at least 150 minutes each week. The exercise should increase your heart rate and make you sweat (moderate-intensity exercise).  Do strengthening exercises at least twice a week. This is in addition to the moderate-intensity exercise.  Spend less time sitting. Even light physical activity can be beneficial. Watch cholesterol and blood lipids Have your blood tested for lipids and cholesterol at 54 years of age, then have this test every 5 years. Have your cholesterol levels checked more often if:  Your lipid or cholesterol levels are high.  You are older than 54 years of age.  You are at high risk for heart disease. What should I know about cancer screening? Depending on your health history and family history, you may need to have cancer screening at various ages. This may include screening for:  Breast cancer.  Cervical cancer.  Colorectal cancer.  Skin cancer.  Lung cancer. What should I know about heart  disease, diabetes, and high blood pressure? Blood pressure and heart disease  High blood pressure causes heart disease and increases the risk of stroke. This is more likely to develop in people who have high blood pressure readings, are of African descent, or are overweight.  Have your blood pressure checked: ? Every 3-5 years if you are 16-94 years of age. ? Every year if you are 36 years old or older. Diabetes Have regular diabetes screenings. This checks your fasting blood sugar level. Have the screening done:  Once every three years after age 62 if you are at a normal weight and have a low risk for diabetes.  More often and at a younger age if you are overweight or have a high risk for diabetes. What should I know about preventing infection? Hepatitis B If you have a higher risk for hepatitis B, you should be screened for this virus. Talk with your health care provider to find out if you are at risk for hepatitis B infection. Hepatitis C Testing is recommended for:  Everyone born from [birth year] through 1964.  Anyone with known risk factors for hepatitis C. Sexually transmitted infections (STIs)  Get screened for STIs, including gonorrhea and chlamydia, if: ? You are sexually active and are younger than 54 years of age. ? You are older than 54 years of age and your health care provider tells you that you are at risk for this type of infection. ? Your sexual activity has changed since you were last screened, and you are at increased risk for  chlamydia or gonorrhea. Ask your health care provider if you are at risk.  Ask your health care provider about whether you are at high risk for HIV. Your health care provider may recommend a prescription medicine to help prevent HIV infection. If you choose to take medicine to prevent HIV, you should first get tested for HIV. You should then be tested every 3 months for as long as you are taking the medicine. Pregnancy  If you are about to stop  having your period (premenopausal) and you may become pregnant, seek counseling before you get pregnant.  Take 400 to 800 micrograms (mcg) of folic acid every day if you become pregnant.  Ask for birth control (contraception) if you want to prevent pregnancy. Osteoporosis and menopause Osteoporosis is a disease in which the bones lose minerals and strength with aging. This can result in bone fractures. If you are 79 years old or older, or if you are at risk for osteoporosis and fractures, ask your health care provider if you should:  Be screened for bone loss.  Take a calcium or vitamin D supplement to lower your risk of fractures.  Be given hormone replacement therapy (HRT) to treat symptoms of menopause. Follow these instructions at home: Lifestyle  Do not use any products that contain nicotine or tobacco, such as cigarettes, e-cigarettes, and chewing tobacco. If you need help quitting, ask your health care provider.  Do not use street drugs.  Do not share needles.  Ask your health care provider for help if you need support or information about quitting drugs. Alcohol use  Do not drink alcohol if: ? Your health care provider tells you not to drink. ? You are pregnant, may be pregnant, or are planning to become pregnant.  If you drink alcohol: ? Limit how much you use to 0-1 drink a day. ? Limit intake if you are breastfeeding.  Be aware of how much alcohol is in your drink. In the [place] one drink equals one 12 oz bottle of beer (355 mL), one 5 oz glass of wine (148 mL), or one 1 oz glass of hard liquor (44 mL). General instructions  Schedule regular health, dental, and eye exams.  Stay current with your vaccines.  Tell your health care provider if: ? You often feel depressed. ? You have ever been abused or do not feel safe at home. Summary  Adopting a healthy lifestyle and getting preventive care are important in promoting health and wellness.  Follow your health  care provider's instructions about healthy diet, exercising, and getting tested or screened for diseases.  Follow your health care provider's instructions on monitoring your cholesterol and blood pressure. This information is not intended to replace advice given to you by your health care provider. Make sure you discuss any questions you have with your health care provider. Document Revised: 01/12/2018 Document Reviewed: 01/12/2018 Elsevier Patient Education  2021 [employer].

## 2020-06-17 NOTE — Progress Notes (Signed)
   Subjective:   Patient ID: Ashlee Davis, female    DOB: [birth year], 55 y.o.   MRN: [id number]  HPI The patient is a 55 YO female coming in for physical.   HPI #2: Also having concerns about ongoing arthritis pain (previously on meloxicam and this was helping some, did take 7.5 mg tablets and often had to take 2 for these to be effective, denies new injury or overuse, denies joint swelling).   PMH, [facility], social history reviewed and updated  Review of Systems  Constitutional: Negative.   HENT: Negative.   Eyes: Negative.   Respiratory: Negative for cough, chest tightness and shortness of breath.   Cardiovascular: Negative for chest pain, palpitations and leg swelling.  Gastrointestinal: Negative for abdominal distention, abdominal pain, constipation, diarrhea, nausea and vomiting.  Musculoskeletal: Positive for arthralgias and myalgias.  Skin: Negative.   Neurological: Negative.   Psychiatric/Behavioral: Negative.     Objective:  Physical Exam Constitutional:      Appearance: She is well-developed.  HENT:     Head: Normocephalic and atraumatic.  Cardiovascular:     Rate and Rhythm: Normal rate and regular rhythm.  Pulmonary:     Effort: Pulmonary effort is normal. No respiratory distress.     Breath sounds: Normal breath sounds. No wheezing or rales.  Abdominal:     General: Bowel sounds are normal. There is no distension.     Palpations: Abdomen is soft.     Tenderness: There is no abdominal tenderness. There is no rebound.  Musculoskeletal:        General: Tenderness present.     Cervical back: Normal range of motion.  Skin:    General: Skin is warm and dry.  Neurological:     Mental Status: She is alert and oriented to person, place, and time.     Coordination: Coordination normal.     Vitals:   06/17/20 0904  BP: (!) 150/98  Pulse: 80  Temp: 97.9 F (36.6 C)  TempSrc: Oral  SpO2: 98%  Weight: 148 lb 14.4 oz (67.5 kg)  Height: 4\' 9"  (1.448 m)    This  visit occurred during the SARS-CoV-2 public health emergency.  Safety protocols were in place, including screening questions prior to the visit, additional usage of staff PPE, and extensive cleaning of exam room while observing appropriate contact time as indicated for disinfecting solutions.   Assessment & Plan:

## 2020-06-18 ENCOUNTER — Other Ambulatory Visit: Payer: Self-pay | Admitting: Internal Medicine

## 2020-06-18 NOTE — Assessment & Plan Note (Signed)
BP elevated today but had been out of medication. Will resume losartan/hctz 50/12.5 mg 1/2 pill daily. Checking CMP and adjust as needed. Monitor at home over the next 2 weeks and let us know if high.

## 2020-06-18 NOTE — Assessment & Plan Note (Signed)
Checking lipid panel and adjust crestor 5 mg daily. Refilled.

## 2020-06-18 NOTE — Assessment & Plan Note (Signed)
Rx meloxicam 15 mg daily. Checking CMP and adjust as needed.

## 2020-06-18 NOTE — Assessment & Plan Note (Signed)
Flu shot yearly. Covid-19 needs booster counseled. Shingrix declines. Tetanus up to date. Colonoscopy up to date. Mammogram with ob/gyn, pap smear up to date with gyn. Counseled about sun safety and mole surveillance. Counseled about the dangers of distracted driving. Given 10 year screening recommendations.

## 2020-12-20 ENCOUNTER — Encounter: Payer: Self-pay | Admitting: Internal Medicine

## 2020-12-20 ENCOUNTER — Ambulatory Visit (INDEPENDENT_AMBULATORY_CARE_PROVIDER_SITE_OTHER): Payer: BC Managed Care – PPO | Admitting: Internal Medicine

## 2020-12-20 ENCOUNTER — Other Ambulatory Visit: Payer: Self-pay

## 2020-12-20 VITALS — BP 124/72 | HR 90 | Resp 18 | Ht <= 58 in | Wt 149.6 lb

## 2020-12-20 DIAGNOSIS — R7303 Prediabetes: Secondary | ICD-10-CM

## 2020-12-20 LAB — HEMOGLOBIN A1C: Hgb A1c MFr Bld: 6.2 % (ref 4.6–6.5)

## 2020-12-20 NOTE — Progress Notes (Signed)
   Subjective:   Patient ID: Ashlee Davis, female    DOB: 12-14-1965, 55 y.o.   MRN: [id number]  HPI The patient is a 54 YO female coming in for follow up.   Review of Systems  Constitutional: Negative.   HENT: Negative.    Eyes: Negative.   Respiratory:  Negative for cough, chest tightness and shortness of breath.   Cardiovascular:  Negative for chest pain, palpitations and leg swelling.  Gastrointestinal:  Negative for abdominal distention, abdominal pain, constipation, diarrhea, nausea and vomiting.  Musculoskeletal: Negative.   Skin: Negative.   Neurological: Negative.   Psychiatric/Behavioral: Negative.     Objective:  Physical Exam Constitutional:      Appearance: She is well-developed.  HENT:     Head: Normocephalic and atraumatic.  Cardiovascular:     Rate and Rhythm: Normal rate and regular rhythm.  Pulmonary:     Effort: Pulmonary effort is normal. No respiratory distress.     Breath sounds: Normal breath sounds. No wheezing or rales.  Abdominal:     General: Bowel sounds are normal. There is no distension.     Palpations: Abdomen is soft.     Tenderness: There is no abdominal tenderness. There is no rebound.  Musculoskeletal:     Cervical back: Normal range of motion.  Skin:    General: Skin is warm and dry.  Neurological:     Mental Status: She is alert and oriented to person, place, and time.     Coordination: Coordination normal.    Vitals:   12/20/20 1000  BP: 124/72  Pulse: 90  Resp: 18  SpO2: 99%  Weight: 149 lb 9.6 oz (67.9 kg)  Height: 4\' 9"  (1.448 m)    This visit occurred during the SARS-CoV-2 public health emergency.  Safety protocols were in place, including screening questions prior to the visit, additional usage of staff PPE, and extensive cleaning of exam room while observing appropriate contact time as indicated for disinfecting solutions.   Assessment & Plan:

## 2020-12-20 NOTE — Assessment & Plan Note (Signed)
Most recent labs from physical with worsening to 6.3 and now needs every 6 month monitoring. She has not had any dietary changes recently. Checking HgA1c today. Adjust as needed.

## 2020-12-20 NOTE — Patient Instructions (Signed)
We will check the sugar levels today and call you back about the results.

## 2021-01-22 DIAGNOSIS — H02825 Cysts of left lower eyelid: Secondary | ICD-10-CM | POA: Diagnosis not present

## 2021-03-18 NOTE — Progress Notes (Signed)
56 y.o. G0P0000 Married Other or two or more races Not Hispanic or Latino female here for annual exam.  No vaginal bleeding.  Not sexually active. No vaginal issues.  No vasomotor symptoms.    In 2/22 she underwent laparoscopy right salpingectomy oophorectomy and left salpingectomy. Pathology with benign dermoid.  No bowel or bladder issue.   Patient's last menstrual period was 04/24/2016.          Sexually active: No.  The current method of family planning is post menopausal status.    Exercising: Yes.     some Smoker:  no  Health Maintenance: Pap:  03-07-20 normal neg HR HPV History of abnormal Pap:  no MMG:  3-4 yrs ago normal BMD:   never Colonoscopy: 2018, f/u in 10 years.  TDaP:  2014 Gardasil:N/A   reports that she has never smoked. She has never used smokeless tobacco. She reports current alcohol use. She reports that she does not use drugs. Rare ETOH. Homemaker.   Past Medical History:  Diagnosis Date   Allergy    Arthritis    GERD (gastroesophageal reflux disease)    Hyperlipidemia    Hypertension    Nevus    Other and unspecified hyperlipidemia    Right ovarian cyst    Wears glasses     Past Surgical History:  Procedure Laterality Date   colonscopy  age 89   LAPAROSCOPIC BILATERAL SALPINGECTOMY N/A 04/01/2020   Procedure: Laparoscopy with right salpingectomy oophorectomy and left salpingectomy;  Surgeon: Salvadore Dom, MD;  Location: [facility];  Service: Gynecology;  Laterality: N/A;  Laparoscopy with right salpingectomy oophorectomy and left salpingectomy    Current Outpatient Medications  Medication Sig Dispense Refill   losartan-hydrochlorothiazide (HYZAAR) 50-12.5 MG tablet TAKE 1/2 TABLET BY MOUTH DAILY 45 tablet 3   meloxicam (MOBIC) 15 MG tablet Take 1 tablet (15 mg total) by mouth daily. 90 tablet 3   rosuvastatin (CRESTOR) 5 MG tablet TAKE ONE TABLET BY MOUTH DAILY 90 tablet 3   No current facility-administered medications  for this visit.    Family History  Problem Relation Age of Onset   Hypertension Mother    Heart disease Mother 92       AMI    Hyperlipidemia Mother    Hypertension Sister    Hyperlipidemia Sister    Stroke Sister    Colon cancer Sister    Esophageal cancer Neg Hx    Rectal cancer Neg Hx    Stomach cancer Neg Hx    Liver cancer Neg Hx     Review of Systems  Constitutional: Negative.   HENT: Negative.    Eyes: Negative.   Respiratory: Negative.    Cardiovascular: Negative.   Gastrointestinal: Negative.   Endocrine: Negative.   Genitourinary: Negative.   Musculoskeletal:        Leg cramps  Skin: Negative.   Allergic/Immunologic: Negative.   Neurological: Negative.   Hematological: Negative.   Psychiatric/Behavioral: Negative.     Exam:   BP 118/80    Pulse 70    Resp 16    Ht 4' 11.5" (1.511 m)    Wt 143 lb (64.9 kg)    LMP 04/24/2016    BMI 28.40 kg/m   Weight change: @WEIGHTCHANGE @ Height:   Height: 4' 11.5" (151.1 cm)  Ht Readings from Last 3 Encounters:  03/28/21 4' 11.5" (1.511 m)  12/20/20 4\' 9"  (1.448 m)  06/17/20 4\' 9"  (1.448 m)    General appearance: alert, cooperative  and appears stated age Head: Normocephalic, without obvious abnormality, atraumatic Neck: no adenopathy, supple, symmetrical, trachea midline and thyroid normal to inspection and palpation Lungs: clear to auscultation bilaterally Cardiovascular: regular rate and rhythm Breasts: normal appearance, no masses or tenderness Abdomen: soft, non-tender; non distended,  no masses,  no organomegaly Extremities: extremities normal, atraumatic, no cyanosis or edema Skin: Skin color, texture, turgor normal. No rashes or lesions Lymph nodes: Cervical, supraclavicular, and axillary nodes normal. No abnormal inguinal nodes palpated Neurologic: Grossly normal   Pelvic: External genitalia:  no lesions              Urethra:  normal appearing urethra with no masses, tenderness or lesions               Bartholins and Skenes: normal                 Vagina: normal appearing vagina with normal color and discharge, no lesions              Cervix: no lesions               Bimanual Exam:  Uterus:   no masses or tenderness              Adnexa: no mass, fullness, tenderness               Rectovaginal: Confirms               Anus:  normal sphincter tone, no lesions  Royal Hawthorn, CMA chaperoned for the exam.  1. Well woman exam Labs with primary No pap this year Colonoscopy UTD Declines mammogram, aware of risks

## 2021-03-28 ENCOUNTER — Encounter: Payer: Self-pay | Admitting: Obstetrics and Gynecology

## 2021-03-28 ENCOUNTER — Ambulatory Visit (INDEPENDENT_AMBULATORY_CARE_PROVIDER_SITE_OTHER): Payer: BC Managed Care – PPO | Admitting: Obstetrics and Gynecology

## 2021-03-28 ENCOUNTER — Other Ambulatory Visit: Payer: Self-pay

## 2021-03-28 VITALS — BP 118/80 | HR 70 | Resp 16 | Ht 59.5 in | Wt 143.0 lb

## 2021-03-28 DIAGNOSIS — Z01419 Encounter for gynecological examination (general) (routine) without abnormal findings: Secondary | ICD-10-CM | POA: Diagnosis not present

## 2021-03-28 NOTE — Patient Instructions (Signed)

## 2021-04-21 DIAGNOSIS — J029 Acute pharyngitis, unspecified: Secondary | ICD-10-CM | POA: Diagnosis not present

## 2021-04-21 DIAGNOSIS — U071 COVID-19: Secondary | ICD-10-CM | POA: Diagnosis not present

## 2021-04-21 DIAGNOSIS — R051 Acute cough: Secondary | ICD-10-CM | POA: Diagnosis not present

## 2021-04-23 DIAGNOSIS — Z9189 Other specified personal risk factors, not elsewhere classified: Secondary | ICD-10-CM | POA: Diagnosis not present

## 2021-04-23 DIAGNOSIS — U071 COVID-19: Secondary | ICD-10-CM | POA: Diagnosis not present

## 2021-05-22 ENCOUNTER — Encounter: Payer: Self-pay | Admitting: Internal Medicine

## 2021-05-22 ENCOUNTER — Ambulatory Visit (INDEPENDENT_AMBULATORY_CARE_PROVIDER_SITE_OTHER): Payer: BC Managed Care – PPO | Admitting: Internal Medicine

## 2021-05-22 DIAGNOSIS — J0110 Acute frontal sinusitis, unspecified: Secondary | ICD-10-CM | POA: Diagnosis not present

## 2021-05-22 MED ORDER — MELOXICAM 15 MG PO TABS: 15.0000 mg | ORAL_TABLET | Freq: Every day | ORAL | 3 refills | Status: AC

## 2021-05-22 MED ORDER — BENZONATATE 200 MG PO CAPS: 200.0000 mg | ORAL_CAPSULE | Freq: Three times a day (TID) | ORAL | 0 refills | Status: AC | PRN

## 2021-05-22 MED ORDER — DOXYCYCLINE HYCLATE 100 MG PO TABS: 100.0000 mg | ORAL_TABLET | Freq: Two times a day (BID) | ORAL | 0 refills | Status: AC

## 2021-05-22 NOTE — Progress Notes (Signed)
? ?  Subjective:  ? ?Patient ID: Ashlee Davis, female    DOB: Feb 02, 1966, 56 y.o.   MRN: [id number] ? ?HPI ?The patient is a 56 YO female coming in for cough and sinus symptoms for 1 month. ? ?Review of Systems  ?Constitutional:  Positive for activity change. Negative for appetite change, chills, fatigue, fever and unexpected weight change.  ?HENT:  Positive for congestion, postnasal drip, rhinorrhea and sinus pressure. Negative for ear discharge, ear pain, sinus pain, sneezing, sore throat, tinnitus, trouble swallowing and voice change.   ?Eyes: Negative.   ?Respiratory:  Positive for cough. Negative for chest tightness, shortness of breath and wheezing.   ?Cardiovascular: Negative.   ?Gastrointestinal: Negative.   ?Musculoskeletal:  Positive for myalgias.  ?Neurological: Negative.   ? ?Objective:  ?Physical Exam ?Constitutional:   ?   Appearance: She is well-developed.  ?HENT:  ?   Head: Normocephalic and atraumatic.  ?   Comments: Oropharynx with redness and clear drainage, nose with swollen turbinates, TMs normal bilaterally.  ?   Ears:  ?   Comments: Sinus pressure ?Neck:  ?   Thyroid: No thyromegaly.  ?Cardiovascular:  ?   Rate and Rhythm: Normal rate and regular rhythm.  ?Pulmonary:  ?   Effort: Pulmonary effort is normal. No respiratory distress.  ?   Breath sounds: Normal breath sounds. No wheezing or rales.  ?Abdominal:  ?   Palpations: Abdomen is soft.  ?Musculoskeletal:     ?   General: No tenderness.  ?   Cervical back: Normal range of motion.  ?Lymphadenopathy:  ?   Cervical: No cervical adenopathy.  ?Skin: ?   General: Skin is warm and dry.  ?Neurological:  ?   Mental Status: She is alert and oriented to person, place, and time.  ? ? ?Vitals:  ? 05/22/21 1003  ?BP: 114/70  ?Pulse: 88  ?Resp: 18  ?SpO2: 97%  ?Weight: 145 lb 3.2 oz (65.9 kg)  ?Height: 4' 11.5" (1.511 m)  ? ? ?This visit occurred during the SARS-CoV-2 public health emergency.  Safety protocols were in place, including screening questions  prior to the visit, additional usage of staff PPE, and extensive cleaning of exam room while observing appropriate contact time as indicated for disinfecting solutions.  ? ?Assessment & Plan:  ? ?

## 2021-05-22 NOTE — Patient Instructions (Signed)
We have sent in doxycycline to take 1 pill twice a day for 1 week. ? ?We have sent in a cough medicine to use up to 3 times a day called tessalon perles. ? ?We have refilled the meloxicam. ? ? ?

## 2021-05-23 DIAGNOSIS — J329 Chronic sinusitis, unspecified: Secondary | ICD-10-CM | POA: Insufficient documentation

## 2021-05-23 NOTE — Assessment & Plan Note (Signed)
Rx doxycycline 1 week course. Rx tessalon perles for cough. Advised to continue otc antihistamine she is taking.  ?

## 2021-08-11 ENCOUNTER — Other Ambulatory Visit: Payer: Self-pay | Admitting: Internal Medicine

## 2021-08-13 DIAGNOSIS — L720 Epidermal cyst: Secondary | ICD-10-CM | POA: Diagnosis not present

## 2021-08-13 DIAGNOSIS — D2261 Melanocytic nevi of right upper limb, including shoulder: Secondary | ICD-10-CM | POA: Diagnosis not present

## 2021-08-16 ENCOUNTER — Other Ambulatory Visit: Payer: Self-pay | Admitting: Internal Medicine

## 2022-03-31 ENCOUNTER — Ambulatory Visit: Payer: BC Managed Care – PPO | Admitting: Obstetrics and Gynecology

## 2022-06-03 IMAGING — CT CT RENAL STONE PROTOCOL
2 of 6 series · 15 of 46 positions shown, 17 images · non-contrast
Comparison: None.

CLINICAL DATA: Dysuria with flank pain

EXAM:
CT ABDOMEN AND PELVIS WITHOUT CONTRAST
TECHNIQUE: Multidetector CT imaging of the abdomen and pelvis was performed
following the standard protocol without oral or IV contrast.

[Series 2: stone study 5.0 br38 1 · axial · 0.71mm/px · z∈[-676,-306]mm · 12 of 84 slices shown, 14 images]
[im 5/84  soft-tissue]
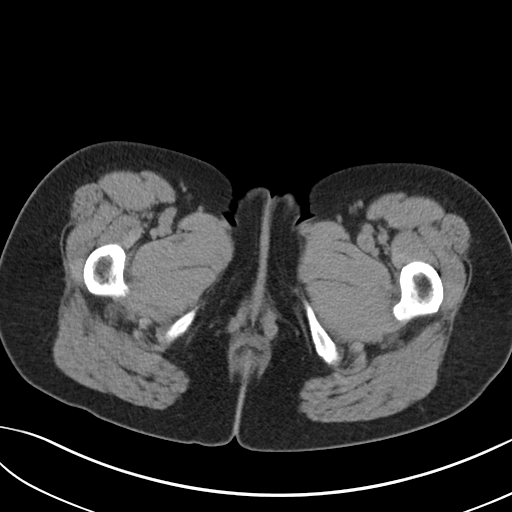
[im 5/84  bone]
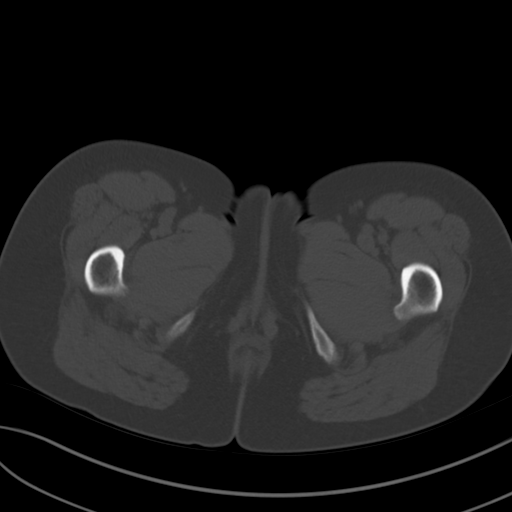
[im 13/84  soft-tissue]
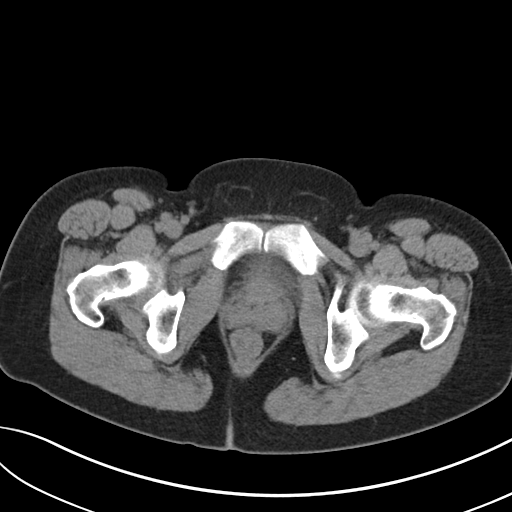
[im 17/84  soft-tissue]
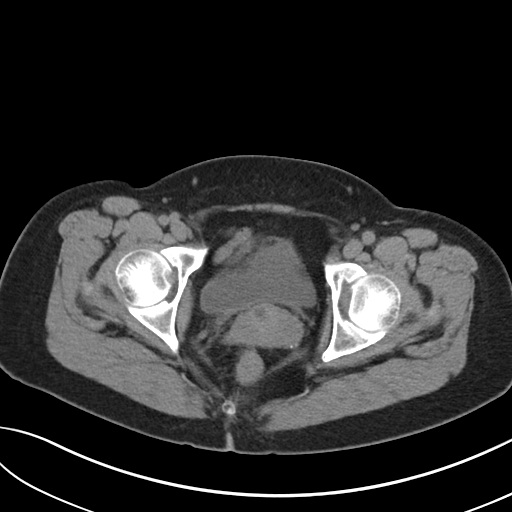
[im 25/84  soft-tissue]
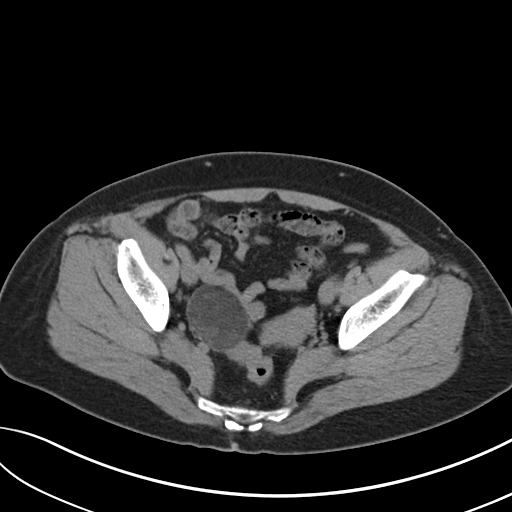
[im 34/84  soft-tissue]
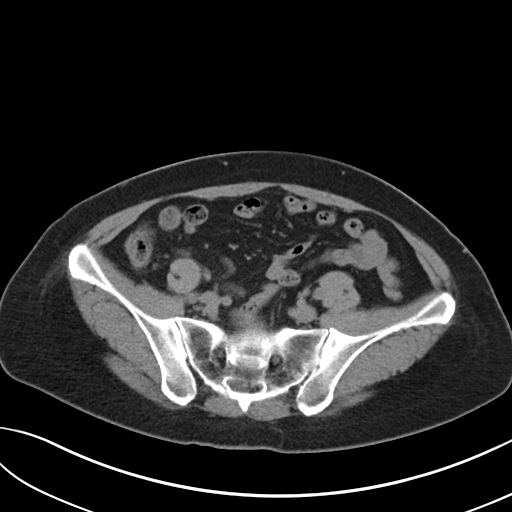
[im 38/84  soft-tissue]
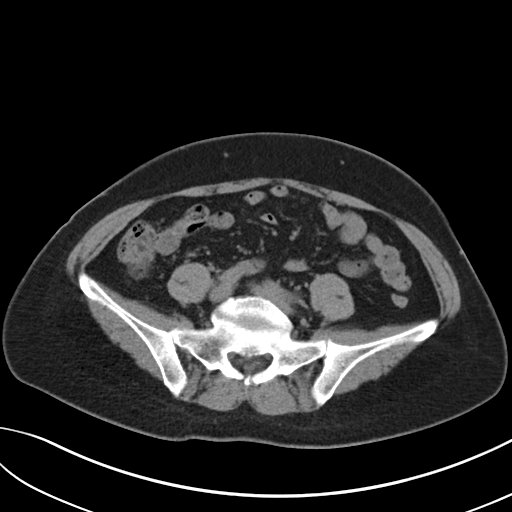
[im 46/84  soft-tissue]
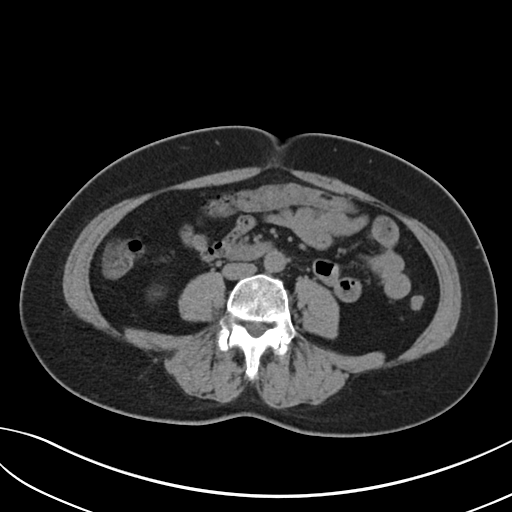
[im 50/84  soft-tissue]
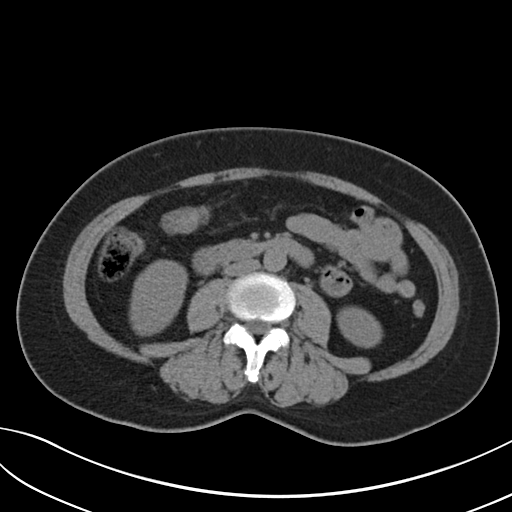
[im 59/84  soft-tissue]
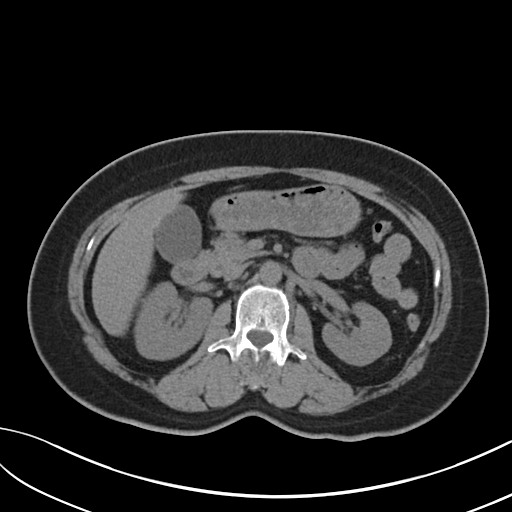
[im 59/84  bone]
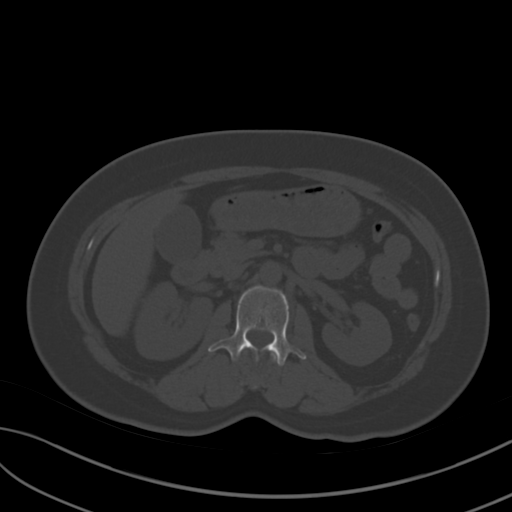
[im 67/84  soft-tissue]
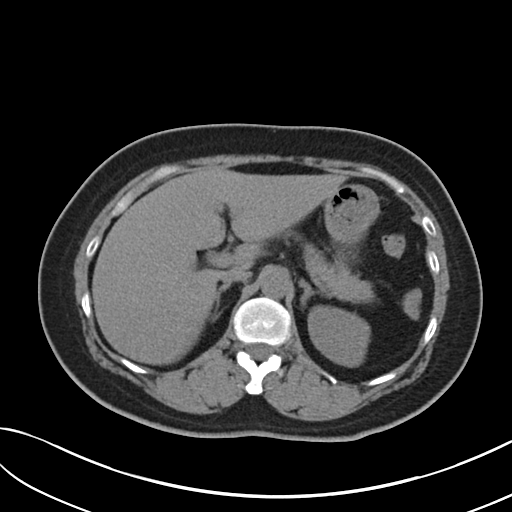
[im 71/84  soft-tissue]
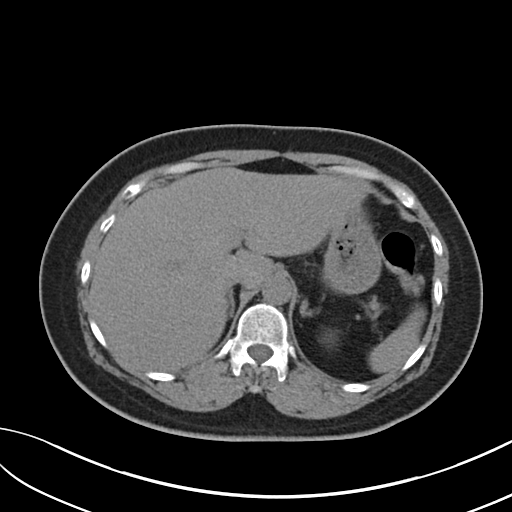
[im 79/84  soft-tissue]
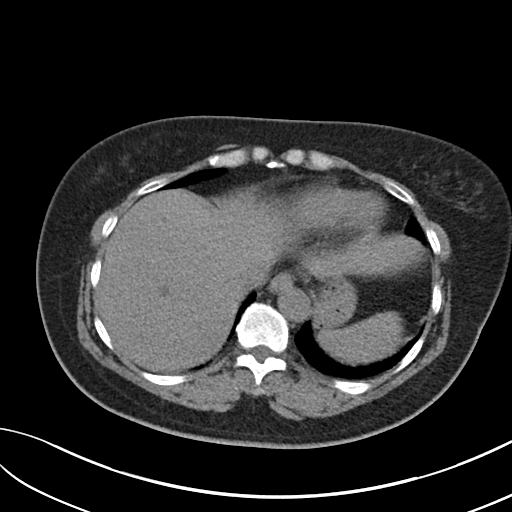

[Series 8: coronal soft tissue · coronal · 0.71mm/px · 3 of 72 slices shown]
[im 24/72  soft-tissue]
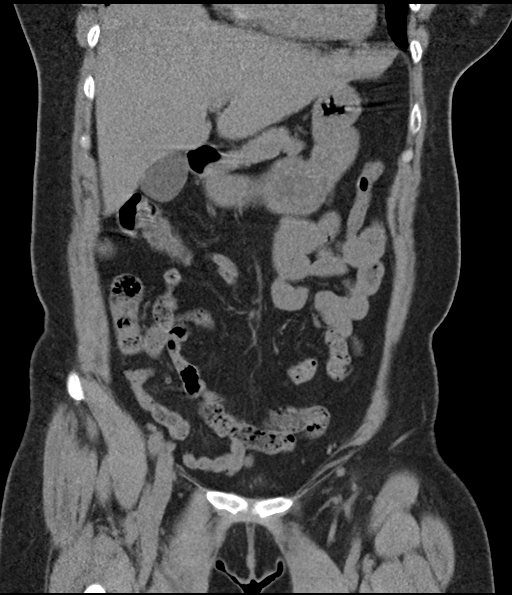
[im 32/72  soft-tissue]
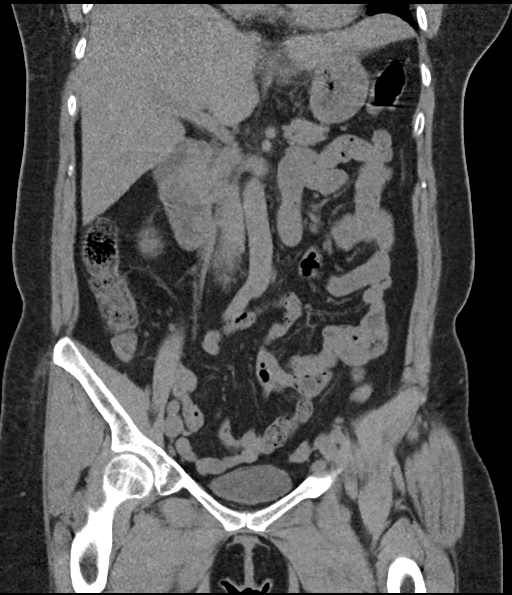
[im 40/72  soft-tissue]
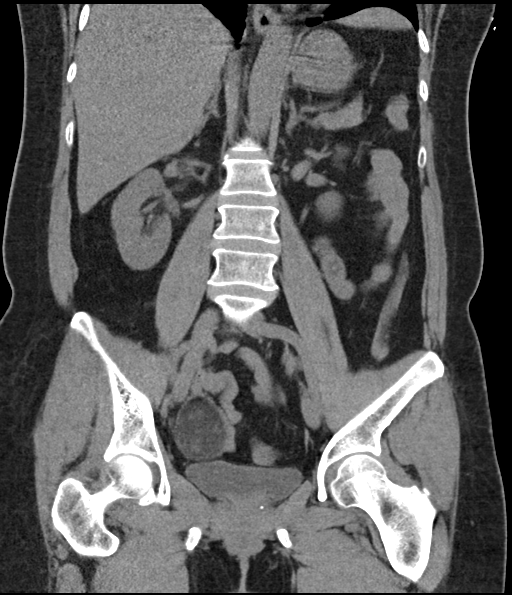

[15 of 46 positions shown; findings below may reference images not displayed]

FINDINGS: Lower chest: There is mild atelectasis in the lung bases. Lung bases
otherwise are clear.

Hepatobiliary: There is a cyst in the posterior aspect of the right
lobe of the liver near the dome measuring 1.7 x 1.6 cm. No other
focal liver lesion evident on noncontrast enhanced study.
Gallbladder wall is not appreciably thickened. There is no biliary
duct dilatation.

Pancreas: There is no pancreatic mass or inflammatory focus.

Spleen: No splenic lesions are evident.

Adrenals/Urinary Tract: Adrenals bilaterally appear normal. There is
no appreciable renal mass or hydronephrosis on either side. There is
no appreciable renal or ureteral calculus on either side. Urinary
bladder is midline with wall thickness within normal limits.

Stomach/Bowel: There is no appreciable bowel wall or mesenteric
thickening. There is no evident bowel obstruction. Terminal ileum
appears normal. There is no appreciable free air or portal venous
air.

Vascular/Lymphatic: There is no abdominal aortic aneurysm. No
vascular lesions are evident on this noncontrast enhanced study.

Reproductive: Uterus is midline. There is a 7 mm nabothian cyst
arising in the cervix anteriorly. There is a mixed attenuation mass
arising in the right adnexa measuring 5.4 x 5.5 x 4.5 cm, an
apparent right ovarian dermoid. No other adnexal mass evident.

Other: No appendiceal inflammation evident. No abscess or ascites in
the abdomen or pelvis. There is slight fat in the umbilicus.

Musculoskeletal: There are no blastic or lytic bone lesions. There
is degenerative change in the lumbar spine. No intramuscular or
abdominal wall lesions are appreciable.
IMPRESSION: 1. Apparent right ovarian dermoid measuring 5.4 x 5.5 x 4.5 cm. Note
that a mass of this size places right ovary at potential increased
risk for torsion. If there is clinical concern for potential
intermittent torsion, correlation with pelvic ultrasound including
Doppler assessment may be warranted.

2. No renal or ureteral calculi evident. No hydronephrosis on either
side evident. Urinary bladder wall thickness is normal.

3. No bowel wall thickening or bowel obstruction. No abscess in the
abdomen pelvis. Appendix region appears normal.

These results will be called to the ordering clinician or
representative by the Radiologist Assistant, and communication
documented in the PACS or [REDACTED].
